# Patient Record
Sex: Female | Born: 1975 | Race: White | Hispanic: No | Marital: Married | Smoking: Never smoker
Health system: Southern US, Community
[De-identification: ages and names within clinical notes are randomized; demographics above are authoritative.]

## PROBLEM LIST (undated history)

## (undated) DIAGNOSIS — E039 Hypothyroidism, unspecified: Secondary | ICD-10-CM

## (undated) DIAGNOSIS — E785 Hyperlipidemia, unspecified: Secondary | ICD-10-CM

## (undated) DIAGNOSIS — M549 Dorsalgia, unspecified: Secondary | ICD-10-CM

## (undated) DIAGNOSIS — M2550 Pain in unspecified joint: Secondary | ICD-10-CM

## (undated) DIAGNOSIS — M069 Rheumatoid arthritis, unspecified: Secondary | ICD-10-CM

## (undated) HISTORY — DX: Hypothyroidism, unspecified: E03.9

## (undated) HISTORY — PX: WRIST SURGERY: SHX841

## (undated) HISTORY — DX: Hyperlipidemia, unspecified: E78.5

## (undated) HISTORY — DX: Pain in unspecified joint: M25.50

## (undated) HISTORY — DX: Dorsalgia, unspecified: M54.9

## (undated) HISTORY — DX: Rheumatoid arthritis, unspecified: M06.9

## (undated) HISTORY — PX: EYE SURGERY: SHX253

## (undated) HISTORY — PX: HIP SURGERY: SHX245

---

## 1998-05-06 ENCOUNTER — Emergency Department (HOSPITAL_COMMUNITY): Admission: EM | Admit: 1998-05-06 | Discharge: 1998-05-06 | Payer: Self-pay | Admitting: Emergency Medicine

## 2001-01-12 ENCOUNTER — Emergency Department (HOSPITAL_COMMUNITY): Admission: EM | Admit: 2001-01-12 | Discharge: 2001-01-12 | Payer: Self-pay | Admitting: Emergency Medicine

## 2003-06-03 ENCOUNTER — Emergency Department (HOSPITAL_COMMUNITY): Admission: EM | Admit: 2003-06-03 | Discharge: 2003-06-03 | Payer: Self-pay | Admitting: Emergency Medicine

## 2004-09-28 ENCOUNTER — Encounter: Admission: RE | Admit: 2004-09-28 | Discharge: 2004-09-28 | Payer: Self-pay | Admitting: Orthopedic Surgery

## 2004-09-28 IMAGING — RF DG FL GUIDE NDL PLMT  - NRPT [facility]
2 series · 2 of 2 positions shown · IV contrast (omniscan)
Comparison: none

CLINICAL DATA: Right hip pain.
 RIGHT HIP INJECTION FOR MRI:
 The skin anterior and lateral to the right hip was cleansed and anesthetized.  A 22 gauge spinal needle was directed into the hip joint on one pass.  10 cc of  fluid containing dilute iodinated contrast with 1% Lidocaine and a .5 cc of Omniscan were instilled.  The procedure was well-tolerated, and the patient was taken to MR 
 in good condition.  The arthrogram appears grossly normal.

[Series 1: (hospital) · 1 of 1 slices shown]
[im 1/1]
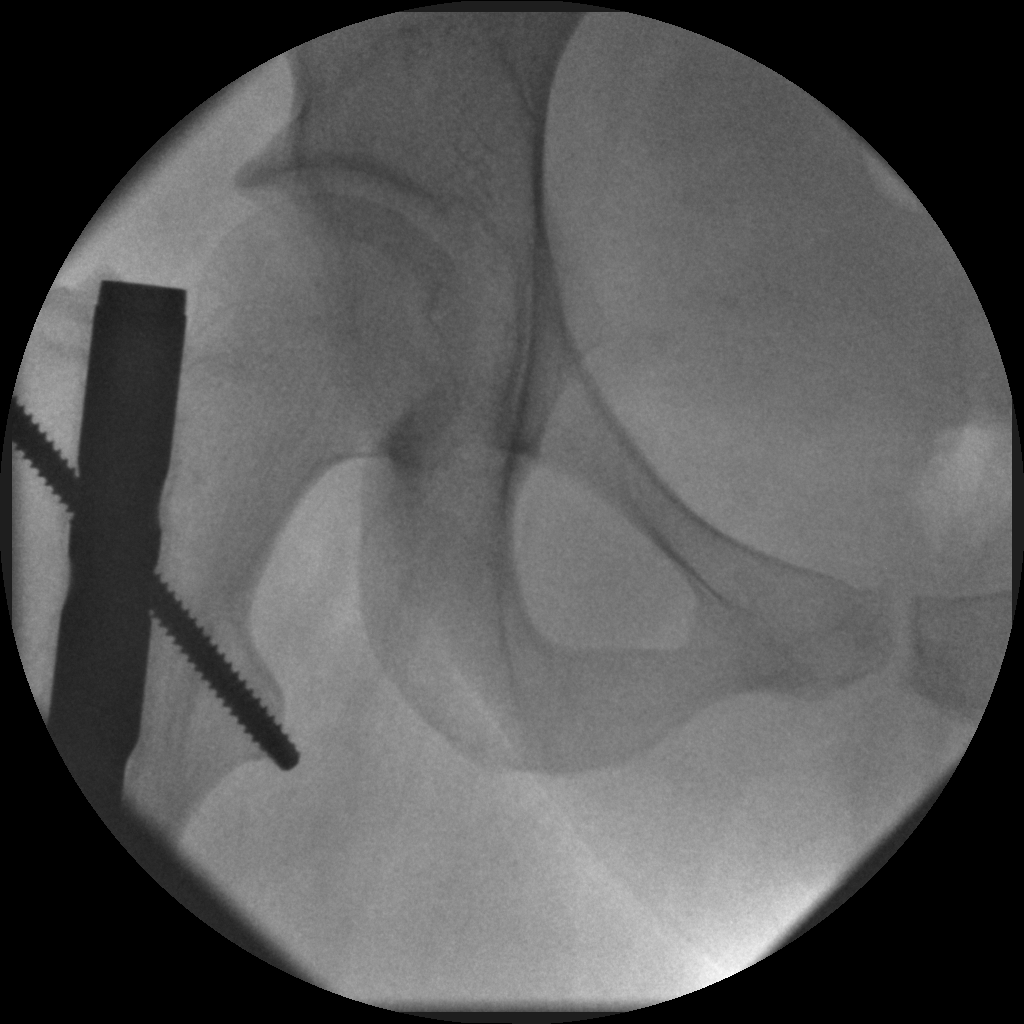

[Series 2: arthrogram · 1 of 1 slices shown]
[im 1/1]
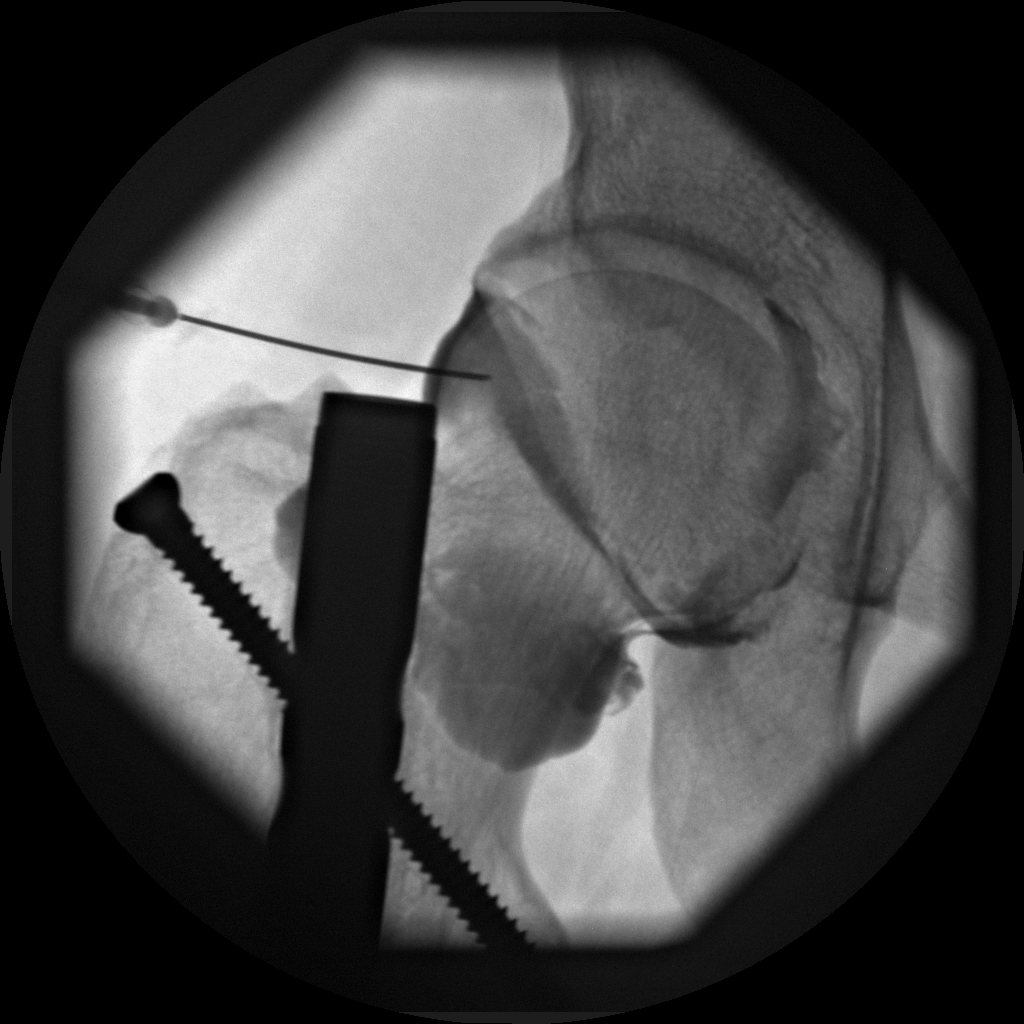

[2 of 2 positions shown; findings below may reference images not displayed]

IMPRESSION: Right hip injection for MR.

## 2005-06-18 ENCOUNTER — Emergency Department (HOSPITAL_COMMUNITY): Admission: EM | Admit: 2005-06-18 | Discharge: 2005-06-18 | Payer: Self-pay | Admitting: Family Medicine

## 2007-12-15 ENCOUNTER — Emergency Department (HOSPITAL_COMMUNITY): Admission: EM | Admit: 2007-12-15 | Discharge: 2007-12-15 | Payer: Self-pay | Admitting: Family Medicine

## 2014-08-06 ENCOUNTER — Encounter: Payer: Self-pay | Admitting: Family Medicine

## 2014-09-08 DIAGNOSIS — R002 Palpitations: Secondary | ICD-10-CM | POA: Insufficient documentation

## 2015-10-15 DIAGNOSIS — Z6835 Body mass index (BMI) 35.0-35.9, adult: Secondary | ICD-10-CM | POA: Diagnosis not present

## 2015-10-15 DIAGNOSIS — E039 Hypothyroidism, unspecified: Secondary | ICD-10-CM | POA: Diagnosis not present

## 2015-10-15 DIAGNOSIS — E669 Obesity, unspecified: Secondary | ICD-10-CM | POA: Diagnosis not present

## 2016-07-21 DIAGNOSIS — E039 Hypothyroidism, unspecified: Secondary | ICD-10-CM | POA: Diagnosis not present

## 2016-07-21 DIAGNOSIS — Z6834 Body mass index (BMI) 34.0-34.9, adult: Secondary | ICD-10-CM | POA: Diagnosis not present

## 2016-07-21 DIAGNOSIS — M47812 Spondylosis without myelopathy or radiculopathy, cervical region: Secondary | ICD-10-CM | POA: Diagnosis not present

## 2016-07-21 DIAGNOSIS — M542 Cervicalgia: Secondary | ICD-10-CM | POA: Diagnosis not present

## 2016-08-02 DIAGNOSIS — Z6834 Body mass index (BMI) 34.0-34.9, adult: Secondary | ICD-10-CM | POA: Diagnosis not present

## 2016-08-02 DIAGNOSIS — M542 Cervicalgia: Secondary | ICD-10-CM | POA: Diagnosis not present

## 2016-08-02 DIAGNOSIS — Z139 Encounter for screening, unspecified: Secondary | ICD-10-CM | POA: Diagnosis not present

## 2016-12-02 DIAGNOSIS — N3090 Cystitis, unspecified without hematuria: Secondary | ICD-10-CM | POA: Diagnosis not present

## 2016-12-02 DIAGNOSIS — R300 Dysuria: Secondary | ICD-10-CM | POA: Diagnosis not present

## 2017-02-04 DIAGNOSIS — M25571 Pain in right ankle and joints of right foot: Secondary | ICD-10-CM | POA: Diagnosis not present

## 2017-03-03 DIAGNOSIS — M5432 Sciatica, left side: Secondary | ICD-10-CM | POA: Diagnosis not present

## 2017-03-03 DIAGNOSIS — M25552 Pain in left hip: Secondary | ICD-10-CM | POA: Diagnosis not present

## 2017-03-06 DIAGNOSIS — M545 Low back pain: Secondary | ICD-10-CM | POA: Diagnosis not present

## 2017-03-21 DIAGNOSIS — M5137 Other intervertebral disc degeneration, lumbosacral region: Secondary | ICD-10-CM | POA: Diagnosis not present

## 2017-03-21 DIAGNOSIS — M545 Low back pain: Secondary | ICD-10-CM | POA: Diagnosis not present

## 2017-03-21 DIAGNOSIS — M7062 Trochanteric bursitis, left hip: Secondary | ICD-10-CM | POA: Diagnosis not present

## 2017-04-03 DIAGNOSIS — J0100 Acute maxillary sinusitis, unspecified: Secondary | ICD-10-CM | POA: Diagnosis not present

## 2017-04-09 DIAGNOSIS — R509 Fever, unspecified: Secondary | ICD-10-CM | POA: Diagnosis not present

## 2017-04-09 DIAGNOSIS — J0100 Acute maxillary sinusitis, unspecified: Secondary | ICD-10-CM | POA: Diagnosis not present

## 2017-04-09 DIAGNOSIS — J209 Acute bronchitis, unspecified: Secondary | ICD-10-CM | POA: Diagnosis not present

## 2017-09-14 DIAGNOSIS — H5015 Alternating exotropia: Secondary | ICD-10-CM | POA: Diagnosis not present

## 2017-10-18 DIAGNOSIS — E6609 Other obesity due to excess calories: Secondary | ICD-10-CM | POA: Insufficient documentation

## 2017-10-18 DIAGNOSIS — E669 Obesity, unspecified: Secondary | ICD-10-CM | POA: Insufficient documentation

## 2017-10-18 DIAGNOSIS — E039 Hypothyroidism, unspecified: Secondary | ICD-10-CM | POA: Insufficient documentation

## 2017-10-18 DIAGNOSIS — Z6831 Body mass index (BMI) 31.0-31.9, adult: Secondary | ICD-10-CM | POA: Insufficient documentation

## 2017-10-18 DIAGNOSIS — Z8679 Personal history of other diseases of the circulatory system: Secondary | ICD-10-CM | POA: Insufficient documentation

## 2018-03-14 DIAGNOSIS — E782 Mixed hyperlipidemia: Secondary | ICD-10-CM | POA: Insufficient documentation

## 2019-01-30 DIAGNOSIS — M542 Cervicalgia: Secondary | ICD-10-CM | POA: Diagnosis not present

## 2019-04-10 DIAGNOSIS — M542 Cervicalgia: Secondary | ICD-10-CM | POA: Diagnosis not present

## 2019-04-15 DIAGNOSIS — M542 Cervicalgia: Secondary | ICD-10-CM | POA: Diagnosis not present

## 2019-04-23 DIAGNOSIS — M5412 Radiculopathy, cervical region: Secondary | ICD-10-CM | POA: Diagnosis not present

## 2019-04-23 DIAGNOSIS — M542 Cervicalgia: Secondary | ICD-10-CM | POA: Diagnosis not present

## 2019-04-23 DIAGNOSIS — M5022 Other cervical disc displacement, mid-cervical region, unspecified level: Secondary | ICD-10-CM | POA: Diagnosis not present

## 2019-04-30 DIAGNOSIS — M542 Cervicalgia: Secondary | ICD-10-CM | POA: Diagnosis not present

## 2019-04-30 DIAGNOSIS — M50123 Cervical disc disorder at C6-C7 level with radiculopathy: Secondary | ICD-10-CM | POA: Diagnosis not present

## 2019-05-07 DIAGNOSIS — M50123 Cervical disc disorder at C6-C7 level with radiculopathy: Secondary | ICD-10-CM | POA: Diagnosis not present

## 2019-05-07 DIAGNOSIS — M542 Cervicalgia: Secondary | ICD-10-CM | POA: Diagnosis not present

## 2019-05-09 DIAGNOSIS — M50123 Cervical disc disorder at C6-C7 level with radiculopathy: Secondary | ICD-10-CM | POA: Diagnosis not present

## 2019-05-09 DIAGNOSIS — M542 Cervicalgia: Secondary | ICD-10-CM | POA: Diagnosis not present

## 2019-05-14 DIAGNOSIS — M542 Cervicalgia: Secondary | ICD-10-CM | POA: Diagnosis not present

## 2019-05-14 DIAGNOSIS — M50123 Cervical disc disorder at C6-C7 level with radiculopathy: Secondary | ICD-10-CM | POA: Diagnosis not present

## 2019-05-16 DIAGNOSIS — M542 Cervicalgia: Secondary | ICD-10-CM | POA: Diagnosis not present

## 2019-05-16 DIAGNOSIS — M50123 Cervical disc disorder at C6-C7 level with radiculopathy: Secondary | ICD-10-CM | POA: Diagnosis not present

## 2019-05-21 DIAGNOSIS — M542 Cervicalgia: Secondary | ICD-10-CM | POA: Diagnosis not present

## 2019-05-21 DIAGNOSIS — M50123 Cervical disc disorder at C6-C7 level with radiculopathy: Secondary | ICD-10-CM | POA: Diagnosis not present

## 2019-05-28 DIAGNOSIS — M542 Cervicalgia: Secondary | ICD-10-CM | POA: Diagnosis not present

## 2019-05-28 DIAGNOSIS — M50123 Cervical disc disorder at C6-C7 level with radiculopathy: Secondary | ICD-10-CM | POA: Diagnosis not present

## 2019-05-30 DIAGNOSIS — M50123 Cervical disc disorder at C6-C7 level with radiculopathy: Secondary | ICD-10-CM | POA: Diagnosis not present

## 2019-05-30 DIAGNOSIS — M542 Cervicalgia: Secondary | ICD-10-CM | POA: Diagnosis not present

## 2019-06-04 DIAGNOSIS — M542 Cervicalgia: Secondary | ICD-10-CM | POA: Diagnosis not present

## 2019-06-04 DIAGNOSIS — M50123 Cervical disc disorder at C6-C7 level with radiculopathy: Secondary | ICD-10-CM | POA: Diagnosis not present

## 2019-09-12 DIAGNOSIS — H66002 Acute suppurative otitis media without spontaneous rupture of ear drum, left ear: Secondary | ICD-10-CM | POA: Diagnosis not present

## 2019-09-19 DIAGNOSIS — H9202 Otalgia, left ear: Secondary | ICD-10-CM | POA: Diagnosis not present

## 2019-09-19 DIAGNOSIS — H93293 Other abnormal auditory perceptions, bilateral: Secondary | ICD-10-CM | POA: Diagnosis not present

## 2019-10-24 DIAGNOSIS — Z20822 Contact with and (suspected) exposure to covid-19: Secondary | ICD-10-CM | POA: Diagnosis not present

## 2019-10-26 ENCOUNTER — Ambulatory Visit (HOSPITAL_COMMUNITY)
Admission: RE | Admit: 2019-10-26 | Discharge: 2019-10-26 | Disposition: A | Discharge status: Home / Self Care | Payer: BC Managed Care – PPO | Source: Ambulatory Visit | Attending: Pulmonary Disease | Admitting: Pulmonary Disease

## 2019-10-26 ENCOUNTER — Other Ambulatory Visit: Payer: Self-pay | Admitting: Family

## 2019-10-26 ENCOUNTER — Telehealth: Payer: Self-pay | Admitting: Family

## 2019-10-26 DIAGNOSIS — U071 COVID-19: Secondary | ICD-10-CM

## 2019-10-26 DIAGNOSIS — Z6832 Body mass index (BMI) 32.0-32.9, adult: Secondary | ICD-10-CM | POA: Insufficient documentation

## 2019-10-26 MED ORDER — SODIUM CHLORIDE 0.9 % IV SOLN
1200.0000 mg | Freq: Once | INTRAVENOUS | Status: AC
  Administered 2019-10-26: 1200 mg via INTRAVENOUS

## 2019-10-26 MED ORDER — ALBUTEROL SULFATE HFA 108 (90 BASE) MCG/ACT IN AERS: 2.0000 | INHALATION_SPRAY | Freq: Once | RESPIRATORY_TRACT | Status: DC | PRN

## 2019-10-26 MED ORDER — SODIUM CHLORIDE 0.9 % IV SOLN: INTRAVENOUS | Status: DC | PRN

## 2019-10-26 MED ORDER — FAMOTIDINE IN NACL 20-0.9 MG/50ML-% IV SOLN: 20.0000 mg | Freq: Once | INTRAVENOUS | Status: DC | PRN

## 2019-10-26 MED ORDER — EPINEPHRINE 0.3 MG/0.3ML IJ SOAJ: 0.3000 mg | Freq: Once | INTRAMUSCULAR | Status: DC | PRN

## 2019-10-26 MED ORDER — METHYLPREDNISOLONE SODIUM SUCC 125 MG IJ SOLR: 125.0000 mg | Freq: Once | INTRAMUSCULAR | Status: DC | PRN

## 2019-10-26 MED ORDER — DIPHENHYDRAMINE HCL 50 MG/ML IJ SOLN: 50.0000 mg | Freq: Once | INTRAMUSCULAR | Status: DC | PRN

## 2019-10-26 NOTE — Progress Notes (Signed)
I connected by phone with Yisroel Ramming on 10/26/2019 at 9:27 AM to discuss the potential use of a new treatment for mild to moderate COVID-19 viral infection in non-hospitalized patients.  This patient is a 44 y.o. female that meets the FDA criteria for Emergency Use Authorization of COVID monoclonal antibody casirivimab/imdevimab.  Has a (+) direct SARS-CoV-2 viral test result  Has mild or moderate COVID-19   Is NOT hospitalized due to COVID-19  Is within 10 days of symptom onset  Has at least one of the high risk factor(s) for progression to severe COVID-19 and/or hospitalization as defined in EUA.  Specific high risk criteria : BMI > 25   I have spoken and communicated the following to the patient or parent/caregiver regarding COVID monoclonal antibody treatment:  1. FDA has authorized the emergency use for the treatment of mild to moderate COVID-19 in adults and pediatric patients with positive results of direct SARS-CoV-2 viral testing who are 42 years of age and older weighing at least 40 kg, and who are at high risk for progressing to severe COVID-19 and/or hospitalization.  2. The significant known and potential risks and benefits of COVID monoclonal antibody, and the extent to which such potential risks and benefits are unknown.  3. Information on available alternative treatments and the risks and benefits of those alternatives, including clinical trials.  4. Patients treated with COVID monoclonal antibody should continue to self-isolate and use infection control measures (e.g., wear mask, isolate, social distance, avoid sharing personal items, clean and disinfect "high touch" surfaces, and frequent handwashing) according to CDC guidelines.   5. The patient or parent/caregiver has the option to accept or refuse COVID monoclonal antibody treatment.  After reviewing this information with the patient, The patient agreed to proceed with receiving casirivimab\imdevimab infusion and  will be provided a copy of the Fact sheet prior to receiving the infusion.   Jeanine Luz, NP 10/26/2019 9:27 AM

## 2019-10-26 NOTE — Discharge Instructions (Signed)

## 2019-10-26 NOTE — Telephone Encounter (Signed)
Called to Discuss with patient about Covid symptoms and the use of the monoclonal antibody infusion for those with mild to moderate Covid symptoms and at a high risk of hospitalization.     Pt appears to qualify for this infusion due to co-morbid conditions and/or a member of an at-risk group in accordance with the FDA Emergency Use Authorization.    Symptoms started 10/25/19.  [facility] positive test positive  today. Current symptoms include fevers, coughing, headache, and chest pain at times. Qualifying risk factors include BMI >25. Discussed the risks and benefits of treatment with Regeneron and she wishes to continue with therapy.   Hello Kaitlyn Stone,   You have been scheduled to receive Regeneron (the monoclonal antibody we discussed) on : 10/25/19 at 2pm  If you have been tested outside of a [facility] - you MUST bring a copy of your positive test with you the morning of your appointment. You may take a photo of this and upload to your MyChart portal or have the testing facility fax the result to [phone]    The address for the infusion clinic site is:  --GPS address is [address] - the parking is located near [employer] building where you will see  COVID19 Infusion feather banner marking the entrance to parking.   (see photos below)            --Enter into the 2nd entrance where the "wave, flag banner" is at the road. Turn into this 2nd entrance and immediately turn left to park in 1 of the 5 parking spots.   --Please stay in your car and call the desk for assistance inside [phone].   --Average time in department is roughly 2 hours for Regeneron treatment - this includes preparation of the medication, IV start and the required 1 hour monitoring after the infusion.    Should you develop worsening shortness of breath, chest pain or severe breathing problems please do not wait for this appointment and go to the Emergency room for  evaluation and treatment. You will undergo another oxygen screen before your infusion to ensure this is the best treatment option for you. There is a chance that the best decision may be to send you to the Emergency Room for evaluation at the time of your appointment.   The day of your visit you should: Marland Kitchen Get plenty of rest the night before and drink plenty of water . Eat a light meal/snack before coming and take your medications as prescribed  . Wear warm, comfortable clothes with a shirt that can roll-up over the elbow (will need IV start).  . Wear a mask  . Consider bringing some activity to help pass the time  Many commercial insurers are waiving bills related to COVID treatment however some have ranged from $300-640. We are starting to see some insurers send bills to patients later for the administration of the medication - we are learning more information but you may receive a bill after your appointment.  Please contact your insurance agent to discuss prior to your appointment if you would like further details about billing specific to your policy.    The CPT code is [phone] for your reference.    Marcos Eke, NP 10/26/2019 9:26 AM

## 2019-10-26 NOTE — Progress Notes (Signed)
  Diagnosis: COVID-19  Physician: Patrick Wright, MD  Procedure: Covid Infusion Clinic Med: casirivimab\imdevimab infusion - Provided patient with casirivimab\imdevimab fact sheet for patients, parents and caregivers prior to infusion.  Complications: No immediate complications noted.  Discharge: Discharged home   Kaitlyn Stone 10/26/2019  

## 2019-11-12 DIAGNOSIS — U071 COVID-19: Secondary | ICD-10-CM | POA: Diagnosis not present

## 2019-11-21 DIAGNOSIS — E782 Mixed hyperlipidemia: Secondary | ICD-10-CM | POA: Diagnosis not present

## 2019-11-21 DIAGNOSIS — E039 Hypothyroidism, unspecified: Secondary | ICD-10-CM | POA: Diagnosis not present

## 2019-11-21 DIAGNOSIS — Z1231 Encounter for screening mammogram for malignant neoplasm of breast: Secondary | ICD-10-CM | POA: Diagnosis not present

## 2019-12-09 DIAGNOSIS — Z1231 Encounter for screening mammogram for malignant neoplasm of breast: Secondary | ICD-10-CM | POA: Diagnosis not present

## 2020-04-20 DIAGNOSIS — M25521 Pain in right elbow: Secondary | ICD-10-CM | POA: Diagnosis not present

## 2020-05-28 ENCOUNTER — Encounter (INDEPENDENT_AMBULATORY_CARE_PROVIDER_SITE_OTHER): Payer: Self-pay | Admitting: Family Medicine

## 2020-05-28 ENCOUNTER — Other Ambulatory Visit: Payer: Self-pay

## 2020-05-28 ENCOUNTER — Ambulatory Visit (INDEPENDENT_AMBULATORY_CARE_PROVIDER_SITE_OTHER): Payer: BC Managed Care – PPO | Admitting: Family Medicine

## 2020-05-28 VITALS — BP 120/76 | HR 65 | Temp 98.1°F | Ht 62.0 in | Wt 182.0 lb

## 2020-05-28 DIAGNOSIS — M069 Rheumatoid arthritis, unspecified: Secondary | ICD-10-CM

## 2020-05-28 DIAGNOSIS — Z1331 Encounter for screening for depression: Secondary | ICD-10-CM | POA: Diagnosis not present

## 2020-05-28 DIAGNOSIS — R948 Abnormal results of function studies of other organs and systems: Secondary | ICD-10-CM | POA: Diagnosis not present

## 2020-05-28 DIAGNOSIS — E669 Obesity, unspecified: Secondary | ICD-10-CM

## 2020-05-28 DIAGNOSIS — R0602 Shortness of breath: Secondary | ICD-10-CM

## 2020-05-28 DIAGNOSIS — R5383 Other fatigue: Secondary | ICD-10-CM | POA: Diagnosis not present

## 2020-05-28 DIAGNOSIS — R7301 Impaired fasting glucose: Secondary | ICD-10-CM | POA: Diagnosis not present

## 2020-05-28 DIAGNOSIS — Z6833 Body mass index (BMI) 33.0-33.9, adult: Secondary | ICD-10-CM

## 2020-05-28 DIAGNOSIS — Z0289 Encounter for other administrative examinations: Secondary | ICD-10-CM

## 2020-05-28 DIAGNOSIS — E038 Other specified hypothyroidism: Secondary | ICD-10-CM | POA: Diagnosis not present

## 2020-05-28 DIAGNOSIS — Z9189 Other specified personal risk factors, not elsewhere classified: Secondary | ICD-10-CM | POA: Diagnosis not present

## 2020-05-28 DIAGNOSIS — E782 Mixed hyperlipidemia: Secondary | ICD-10-CM

## 2020-05-28 MED ORDER — MELOXICAM 15 MG PO TABS: 15.0000 mg | ORAL_TABLET | Freq: Every day | ORAL | 0 refills | Status: DC

## 2020-05-29 LAB — ANEMIA PANEL
Ferritin: 233 ng/mL — ABNORMAL HIGH (ref 15–150)
Folate, Hemolysate: 339 ng/mL
Folate, RBC: 721 ng/mL (ref >498)
Hematocrit: 47 % — ABNORMAL HIGH (ref 34.0–46.6)
Iron Saturation: 28 % (ref 15–55)
Iron: 89 ug/dL (ref 27–159)
Retic Ct Pct: 1.2 % (ref 0.6–2.6)
Total Iron Binding Capacity: 317 ug/dL (ref 250–450)
UIBC: 228 ug/dL (ref 131–425)
Vitamin B-12: 503 pg/mL (ref 232–1245)

## 2020-05-29 LAB — HEMOGLOBIN A1C
Est. average glucose Bld gHb Est-mCnc: 117 mg/dL
Hgb A1c MFr Bld: 5.7 % — ABNORMAL HIGH (ref 4.8–5.6)

## 2020-05-29 LAB — CBC WITH DIFFERENTIAL/PLATELET
Basophils Absolute: 0 10*3/uL (ref 0.0–0.2)
Basos: 0 %
EOS (ABSOLUTE): 0.1 10*3/uL (ref 0.0–0.4)
Eos: 2 %
Hemoglobin: 15.3 g/dL (ref 11.1–15.9)
Immature Grans (Abs): 0 10*3/uL (ref 0.0–0.1)
Immature Granulocytes: 0 %
Lymphocytes Absolute: 1.7 10*3/uL (ref 0.7–3.1)
Lymphs: 31 %
MCH: 28.2 pg (ref 26.6–33.0)
MCHC: 32.6 g/dL (ref 31.5–35.7)
MCV: 87 fL (ref 79–97)
Monocytes Absolute: 0.4 10*3/uL (ref 0.1–0.9)
Monocytes: 7 %
Neutrophils Absolute: 3.2 10*3/uL (ref 1.4–7.0)
Neutrophils: 60 %
Platelets: 235 10*3/uL (ref 150–450)
RBC: 5.42 x10E6/uL — ABNORMAL HIGH (ref 3.77–5.28)
RDW: 14.2 % (ref 11.7–15.4)
WBC: 5.4 10*3/uL (ref 3.4–10.8)

## 2020-05-29 LAB — COMPREHENSIVE METABOLIC PANEL
ALT: 16 IU/L (ref 0–32)
AST: 16 IU/L (ref 0–40)
Albumin/Globulin Ratio: 2 (ref 1.2–2.2)
Albumin: 4.9 g/dL — ABNORMAL HIGH (ref 3.8–4.8)
Alkaline Phosphatase: 70 IU/L (ref 44–121)
BUN/Creatinine Ratio: 14 (ref 9–23)
BUN: 15 mg/dL (ref 6–24)
Bilirubin Total: 0.4 mg/dL (ref 0.0–1.2)
CO2: 21 mmol/L (ref 20–29)
Calcium: 9.5 mg/dL (ref 8.7–10.2)
Chloride: 103 mmol/L (ref 96–106)
Creatinine, Ser: 1.05 mg/dL — ABNORMAL HIGH (ref 0.57–1.00)
Globulin, Total: 2.4 g/dL (ref 1.5–4.5)
Glucose: 88 mg/dL (ref 65–99)
Potassium: 4.5 mmol/L (ref 3.5–5.2)
Sodium: 144 mmol/L (ref 134–144)
Total Protein: 7.3 g/dL (ref 6.0–8.5)
eGFR: 67 mL/min/{1.73_m2} (ref >59)

## 2020-05-29 LAB — INSULIN, RANDOM: INSULIN: 16.4 u[IU]/mL (ref 2.6–24.9)

## 2020-05-29 LAB — LIPID PANEL
Chol/HDL Ratio: 4.6 ratio — ABNORMAL HIGH (ref 0.0–4.4)
Cholesterol, Total: 276 mg/dL — ABNORMAL HIGH (ref 100–199)
HDL: 60 mg/dL (ref >39)
LDL Chol Calc (NIH): 191 mg/dL — ABNORMAL HIGH (ref 0–99)
Triglycerides: 138 mg/dL (ref 0–149)
VLDL Cholesterol Cal: 25 mg/dL (ref 5–40)

## 2020-05-29 LAB — VITAMIN D 25 HYDROXY (VIT D DEFICIENCY, FRACTURES): Vit D, 25-Hydroxy: 26.7 ng/mL — ABNORMAL LOW (ref 30.0–100.0)

## 2020-05-29 LAB — TSH: TSH: 2.89 u[IU]/mL (ref 0.450–4.500)

## 2020-05-29 LAB — URIC ACID: Uric Acid: 5.7 mg/dL (ref 2.6–6.2)

## 2020-05-29 LAB — T4, FREE: Free T4: 1.33 ng/dL (ref 0.82–1.77)

## 2020-06-01 NOTE — Progress Notes (Signed)
Chief Complaint:   OBESITY Kaitlyn Stone (MR# [id number]) is a 45 y.o. female who presents for evaluation and treatment of obesity and related comorbidities. Current BMI is Body mass index is 33.29 kg/m. Kaitlyn Stone has been struggling with her weight for many years and has been unsuccessful in either losing weight, maintaining weight loss, or reaching her healthy weight goal.  Kaitlyn Stone is currently in the action stage of change and ready to dedicate time achieving and maintaining a healthier weight. Kaitlyn Stone is interested in becoming our patient and working on intensive lifestyle modifications including (but not limited to) diet and exercise for weight loss.  Kaitlyn Stone provided the following food recall today:  Breakfast:  Skips. Lunch:  Salad, protein shake. Dinner:  5-6 ounces of meat, vegetable. No after dinner eating. She has a sedentary job.  Kaitlyn Stone's habits were reviewed today and are as follows: Her family eats meals together, she thinks her family will eat healthier with her, her desired weight loss is 39 pounds, she started gaining weight after pregnancy, her heaviest weight ever was 199 pounds, she craves sweet potatoes and fruit, she snacks frequently in the evenings, she skips breakfast frequently, she is frequently drinking liquids with calories, she frequently makes poor food choices, she frequently eats larger portions than normal and she struggles with emotional eating.  Depression Screen Kaitlyn Stone's Food and Mood (modified PHQ-9) score was 6.  Depression screen [facility] 2/9 05/28/2020  Decreased Interest 1  Down, Depressed, Hopeless 0  PHQ - 2 Score 1  Altered sleeping 1  Tired, decreased energy 1  Change in appetite 1  Feeling bad or failure about yourself  1  Trouble concentrating 1  Moving slowly or fidgety/restless 0  Suicidal thoughts 0  PHQ-9 Score 6  Difficult doing work/chores Not difficult at all   Assessment/Plan:   1. Other fatigue [name] denies daytime  somnolence and denies waking up still tired. Patent has a history of symptoms of morning headache and snoring. [name] generally gets 6 or 7 hours of sleep per night, and states that she has generally restful sleep. Snoring is present. Apneic episodes are not present. Epworth Sleepiness Score is 4.  [name] does feel that her weight is causing her energy to be lower than it should be. Fatigue may be related to obesity, depression or many other causes. Labs will be ordered, and in the meanwhile, Kaitlyn Stone will focus on self care including making healthy food choices, increasing physical activity and focusing on stress reduction.  - EKG 12-Lead - Anemia panel - CBC with Differential/Platelet - Lipid panel - VITAMIN D 25 Hydroxy (Vit-D Deficiency, Fractures) - TSH - T4, free - Uric acid  2. SOB (shortness of breath) on exertion Kaitlyn Stone notes increasing shortness of breath with exercising and seems to be worsening over time with weight gain. She notes getting out of breath sooner with activity than she used to. This has gotten worse recently. Kaitlyn Stone denies shortness of breath at rest or orthopnea.  Kaitlyn Stone does feel that she gets out of breath more easily that she used to when she exercises. Kaitlyn Stone's shortness of breath appears to be obesity related and exercise induced. She has agreed to work on weight loss and gradually increase exercise to treat her exercise induced shortness of breath. Will continue to monitor closely.  - Anemia panel - CBC with Differential/Platelet - Uric acid  3. Abnormal metabolism, slow [name]'s metabolism is slower than normal.  Plan:  Will check thyroid labs today.  - TSH -  T4, free  4. Other specified hypothyroidism Course: Stable. Medication: Synthroid 125 mcg daily.   Plan:  Continue Synthroid.  Patient was instructed not to take MVM or iron within 4 hours of taking thyroid medications.  We will continue to monitor alongside Endocrinology/PCP as it relates to her  weight loss journey.  Check TSH and free T4 today.  Lab Results  Component Value Date   TSH 2.890 05/28/2020   - TSH - T4, free  5. Mixed hyperlipidemia Course: Not at goal. Lipid-lowering medications: None.  Kaitlyn Stone has a family history of hyperlipidemia.  Plan: Dietary changes: Increase soluble fiber, decrease simple carbohydrates, decrease saturated fat. Exercise changes: Moderate to vigorous-intensity aerobic activity 150 minutes per week or as tolerated. We will continue to monitor along with PCP/specialists as it pertains to her weight loss journey.  Check lipid panel today.  Lab Results  Component Value Date   CHOL 276 (H) 05/28/2020   HDL 60 05/28/2020   LDLCALC 191 (H) 05/28/2020   TRIG 138 05/28/2020   CHOLHDL 4.6 (H) 05/28/2020   Lab Results  Component Value Date   ALT 16 05/28/2020   AST 16 05/28/2020   ALKPHOS 70 05/28/2020   BILITOT 0.4 05/28/2020   The 10-year ASCVD risk score Kaitlyn [name] DC Jr., et al., 2013) is: 1.1%   Values used to calculate the score:     Age: 8 years     Sex: Female     Is Non-Hispanic African American: No     Diabetic: No     Tobacco smoker: No     Systolic Blood Pressure: 120 mmHg     Is BP treated: No     HDL Cholesterol: 60 mg/dL     Total Cholesterol: 276 mg/dL  - Lipid panel  6. Rheumatoid arthritis, involving unspecified site, unspecified whether rheumatoid factor present [facility]) Will place referral to Rheumatology today as well as checking labs.  Will also start Mobic 15 mg daily for anti-inflammatory effect.   - Uric acid - Ambulatory referral to Rheumatology - Start meloxicam (MOBIC) 15 MG tablet; Take 1 tablet (15 mg total) by mouth daily.  Dispense: 30 tablet; Refill: 0  7. Elevated fasting glucose Will check CMP, A1c, and insulin level today, as per below.  - Comprehensive metabolic panel - Hemoglobin A1c - Insulin, random  8. Depression screening Kaitlyn Stone was screened for depression as part of her new patient workup.   PHQ-9 is 6.  Kaitlyn Stone had a positive depression screening. Depression is commonly associated with obesity and often results in emotional eating behaviors. We will monitor this closely and work on CBT to help improve the non-hunger eating patterns. Referral to Psychology may be required if no improvement is seen as she continues in our clinic.  9. At risk for heart disease Due to Shaqueta's current state of health and medical condition(s), she is at a higher risk for heart disease.  This puts the patient at much greater risk to subsequently develop cardiopulmonary conditions that can significantly affect patient's quality of life in a negative manner.    At least 9 minutes were spent on counseling Demiyah about these concerns today. Evidence-based interventions for health behavior change were utilized today including the discussion of self monitoring techniques, problem-solving barriers, and SMART goal setting techniques.  Specifically, regarding patient's less desirable eating habits and patterns, we employed the technique of small changes when Corena has not been able to fully commit to her prudent nutritional plan.  10. Class 1 obesity  with serious comorbidity and body mass index (BMI) of 33.0 to 33.9 in adult, unspecified obesity type  Abella is currently in the action stage of change and her goal is to continue with weight loss efforts. I recommend Roux begin the structured treatment plan as follows:  She has agreed to the Category 1 Plan.  Exercise goals: No exercise has been prescribed at this time.   Behavioral modification strategies: increasing lean protein intake, decreasing simple carbohydrates, increasing vegetables, increasing water intake, decreasing liquid calories, decreasing sodium intake and increasing high fiber foods.  She was informed of the importance of frequent follow-up visits to maximize her success with intensive lifestyle modifications for her multiple health conditions.  She was informed we would discuss her lab results at her next visit unless there is a critical issue that needs to be addressed sooner. Korena agreed to keep her next visit at the agreed upon time to discuss these results.  Objective:   Blood pressure 120/76, pulse 65, temperature 98.1 F (36.7 C), temperature source Oral, height  (1.575 m), weight 182 lb (82.6 kg), SpO2 96 %. Body mass index is 33.29 kg/m.  EKG: Normal sinus rhythm, rate 80 bpm.  Indirect Calorimeter completed today shows a VO2 of 176 and a REE of 1223.  Her calculated basal metabolic rate is 9604 thus her basal metabolic rate is worse than expected.  General: Cooperative, alert, well developed, in no acute distress. HEENT: Conjunctivae and lids unremarkable. Cardiovascular: Regular rhythm.  Lungs: Normal work of breathing. Neurologic: No focal deficits.   Lab Results  Component Value Date   CREATININE 1.05 (H) 05/28/2020   BUN 15 05/28/2020   NA 144 05/28/2020   K 4.5 05/28/2020   CL 103 05/28/2020   CO2 21 05/28/2020   Lab Results  Component Value Date   ALT 16 05/28/2020   AST 16 05/28/2020   ALKPHOS 70 05/28/2020   BILITOT 0.4 05/28/2020   Lab Results  Component Value Date   HGBA1C 5.7 (H) 05/28/2020   Lab Results  Component Value Date   INSULIN 16.4 05/28/2020   Lab Results  Component Value Date   TSH 2.890 05/28/2020   Lab Results  Component Value Date   CHOL 276 (H) 05/28/2020   HDL 60 05/28/2020   LDLCALC 191 (H) 05/28/2020   TRIG 138 05/28/2020   CHOLHDL 4.6 (H) 05/28/2020   Lab Results  Component Value Date   WBC 5.4 05/28/2020   HGB 15.3 05/28/2020   HCT 47.0 (H) 05/28/2020   MCV 87 05/28/2020   PLT 235 05/28/2020   Lab Results  Component Value Date   IRON 89 05/28/2020   TIBC 317 05/28/2020   FERRITIN 233 (H) 05/28/2020   Attestation Statements:   This is the patient's first visit at [facility]. The patient's NEW PATIENT PACKET was reviewed at  length. Included in the packet: current and past health history, medications, allergies, ROS, gynecologic history (women only), surgical history, family history, social history, weight history, weight loss surgery history (for those that have had weight loss surgery), nutritional evaluation, mood and food questionnaire, PHQ9, Epworth questionnaire, sleep habits questionnaire, patient life and health improvement goals questionnaire. These will all be scanned into the patient's chart under media.   During the visit, I independently reviewed the patient's EKG, bioimpedance scale results, and indirect calorimeter results. I used this information to tailor a meal plan for the patient that will help her to lose weight and will improve her  obesity-related conditions going forward. I performed a medically necessary appropriate examination and/or evaluation. I discussed the assessment and treatment plan with the patient. The patient was provided an opportunity to ask questions and all were answered. The patient agreed with the plan and demonstrated an understanding of the instructions. Labs were ordered at this visit and will be reviewed at the next visit unless more critical results need to be addressed immediately. Clinical information was updated and documented in the EMR.   I, Insurance claims handler, CMA, am acting as transcriptionist for Helane Rima, DO  I have reviewed the above documentation for accuracy and completeness, and I agree with the above. Helane Rima, DO

## 2020-06-03 ENCOUNTER — Encounter (INDEPENDENT_AMBULATORY_CARE_PROVIDER_SITE_OTHER): Payer: Self-pay

## 2020-06-09 NOTE — Telephone Encounter (Signed)
LM with [facility] to call back

## 2020-06-10 DIAGNOSIS — M25521 Pain in right elbow: Secondary | ICD-10-CM | POA: Diagnosis not present

## 2020-06-11 ENCOUNTER — Other Ambulatory Visit: Payer: Self-pay

## 2020-06-11 ENCOUNTER — Encounter (INDEPENDENT_AMBULATORY_CARE_PROVIDER_SITE_OTHER): Payer: Self-pay | Admitting: Family Medicine

## 2020-06-11 ENCOUNTER — Ambulatory Visit (INDEPENDENT_AMBULATORY_CARE_PROVIDER_SITE_OTHER): Payer: BC Managed Care – PPO | Admitting: Family Medicine

## 2020-06-11 VITALS — BP 120/82 | HR 68 | Temp 98.1°F | Ht 62.0 in | Wt 175.0 lb

## 2020-06-11 DIAGNOSIS — E559 Vitamin D deficiency, unspecified: Secondary | ICD-10-CM

## 2020-06-11 DIAGNOSIS — E038 Other specified hypothyroidism: Secondary | ICD-10-CM | POA: Diagnosis not present

## 2020-06-11 DIAGNOSIS — M069 Rheumatoid arthritis, unspecified: Secondary | ICD-10-CM

## 2020-06-11 DIAGNOSIS — R7303 Prediabetes: Secondary | ICD-10-CM | POA: Diagnosis not present

## 2020-06-11 DIAGNOSIS — Z6833 Body mass index (BMI) 33.0-33.9, adult: Secondary | ICD-10-CM

## 2020-06-11 DIAGNOSIS — E7800 Pure hypercholesterolemia, unspecified: Secondary | ICD-10-CM

## 2020-06-11 DIAGNOSIS — E669 Obesity, unspecified: Secondary | ICD-10-CM

## 2020-06-11 DIAGNOSIS — Z9189 Other specified personal risk factors, not elsewhere classified: Secondary | ICD-10-CM

## 2020-06-15 ENCOUNTER — Encounter (INDEPENDENT_AMBULATORY_CARE_PROVIDER_SITE_OTHER): Payer: Self-pay | Admitting: Family Medicine

## 2020-06-15 DIAGNOSIS — E559 Vitamin D deficiency, unspecified: Secondary | ICD-10-CM

## 2020-06-15 NOTE — Progress Notes (Signed)
Chief Complaint:   OBESITY Kaitlyn Stone is here to discuss her progress with her obesity treatment plan along with follow-up of her obesity related diagnoses.   Today's visit was #: 2 Starting weight: 182 lbs Starting date: 182 lbs Today's weight: 175 lbs Today's date: 06/11/2020 Total lbs lost to date: 7 lbs Body mass index is 32.01 kg/m.  Total weight loss percentage to date: -3.85%  Interim History:  Kaitlyn Stone denies polyphagia. Current Meal Plan: the Category 1 Plan for 100% of the time.  Current Exercise Plan: 3,000-5,000 steps per day 7 days per week.  Assessment/Plan:   1. Prediabetes Improving, but not optimized. Goal is HgbA1c < 5.7.  Medication: None.    Plan:  Discussed labs with patient today.  She will continue to focus on protein-rich, low simple carbohydrate foods. We reviewed the importance of hydration, regular exercise for stress reduction, and restorative sleep.   Lab Results  Component Value Date   HGBA1C 5.7 (H) 05/28/2020   Lab Results  Component Value Date   INSULIN 16.4 05/28/2020   2. Pure hypercholesterolemia Course: Not at goal. Lipid-lowering medications: None.   Plan: Discussed labs with patient today. Dietary changes: Increase soluble fiber, decrease simple carbohydrates, decrease saturated fat. Exercise changes: Moderate to vigorous-intensity aerobic activity 150 minutes per week or as tolerated. We will continue to monitor along with PCP/specialists as it pertains to her weight loss journey.  Lab Results  Component Value Date   CHOL 276 (H) 05/28/2020   HDL 60 05/28/2020   LDLCALC 191 (H) 05/28/2020   TRIG 138 05/28/2020   CHOLHDL 4.6 (H) 05/28/2020   Lab Results  Component Value Date   ALT 16 05/28/2020   AST 16 05/28/2020   ALKPHOS 70 05/28/2020   BILITOT 0.4 05/28/2020   The 10-year ASCVD risk score Denman [name] DC Jr., et al., 2013) is: 1.1%   Values used to calculate the score:     Age: 45 years     Sex: Female     Is Non-Hispanic  African American: No     Diabetic: No     Tobacco smoker: No     Systolic Blood Pressure: 120 mmHg     Is BP treated: No     HDL Cholesterol: 60 mg/dL     Total Cholesterol: 276 mg/dL  3. Vitamin D deficiency Not at goal. Current vitamin D is 26.7, tested on 05/28/2020. Optimal goal > 50 ng/dL.  She is taking vitamin D 50,000 IU weekly.  Plan:  Discussed labs with patient today.  Continue to take prescription Vitamin D @50 ,000 IU every week as prescribed.  Follow-up for routine testing of Vitamin D, at least 2-3 times per year to avoid over-replacement.  4. Other specified hypothyroidism Course: At goal.  Medication: Synthroid 125 mcg daily.   Plan:  Discussed labs with patient today.  Patient was instructed not to take MVM or iron within 4 hours of taking thyroid medications.  We will continue to monitor alongside Endocrinology/PCP as it relates to her weight loss journey.   Lab Results  Component Value Date   TSH 2.890 05/28/2020   5. Rheumatoid arthritis, involving unspecified site, unspecified whether rheumatoid factor present (HCC) Kaitlyn Stone is taking Mobic 15 mg daily.  She brought her records with her today, and I reviewed them. Will scan and send with previous referral.  6. At risk for heart disease Due to Kaitlyn Stone's current state of health and medical condition(s), she is at a higher risk for  heart disease.  This puts the patient at much greater risk to subsequently develop cardiopulmonary conditions that can significantly affect patient's quality of life in a negative manner.    At least 8 minutes were spent on counseling Kaitlyn Stone about these concerns today. Evidence-based interventions for health behavior change were utilized today including the discussion of self monitoring techniques, problem-solving barriers, and SMART goal setting techniques.  Specifically, regarding patient's less desirable eating habits and patterns, we employed the technique of small changes when Kaitlyn Stone has not  been able to fully commit to her prudent nutritional plan.  7. Obesity, current BMI 32.2  Course: Kaitlyn Stone is currently in the action stage of change. As such, her goal is to continue with weight loss efforts.   Nutrition goals: She has agreed to the Category 1 Plan.   Exercise goals: As is.  Behavioral modification strategies: increasing lean protein intake, decreasing simple carbohydrates, increasing vegetables and increasing water intake.  Kaitlyn Stone has agreed to follow-up with our clinic in 2 weeks. She was informed of the importance of frequent follow-up visits to maximize her success with intensive lifestyle modifications for her multiple health conditions.   Objective:   Blood pressure 120/82, pulse 68, temperature 98.1 F (36.7 C), height 5\' 2"  (1.575 m), weight 175 lb (79.4 kg), SpO2 97 %. Body mass index is 32.01 kg/m.  General: Cooperative, alert, well developed, in no acute distress. HEENT: Conjunctivae and lids unremarkable. Cardiovascular: Regular rhythm.  Lungs: Normal work of breathing. Neurologic: No focal deficits.   Lab Results  Component Value Date   CREATININE 1.05 (H) 05/28/2020   BUN 15 05/28/2020   NA 144 05/28/2020   K 4.5 05/28/2020   CL 103 05/28/2020   CO2 21 05/28/2020   Lab Results  Component Value Date   ALT 16 05/28/2020   AST 16 05/28/2020   ALKPHOS 70 05/28/2020   BILITOT 0.4 05/28/2020   Lab Results  Component Value Date   HGBA1C 5.7 (H) 05/28/2020   Lab Results  Component Value Date   INSULIN 16.4 05/28/2020   Lab Results  Component Value Date   TSH 2.890 05/28/2020   Lab Results  Component Value Date   CHOL 276 (H) 05/28/2020   HDL 60 05/28/2020   LDLCALC 191 (H) 05/28/2020   TRIG 138 05/28/2020   CHOLHDL 4.6 (H) 05/28/2020   Lab Results  Component Value Date   WBC 5.4 05/28/2020   HGB 15.3 05/28/2020   HCT 47.0 (H) 05/28/2020   MCV 87 05/28/2020   PLT 235 05/28/2020   Lab Results  Component Value Date   IRON 89  05/28/2020   TIBC 317 05/28/2020   FERRITIN 233 (H) 05/28/2020   Attestation Statements:   Reviewed by clinician on day of visit: allergies, medications, problem list, medical history, surgical history, family history, social history, and previous encounter notes.  I, 05/30/2020, CMA, am acting as transcriptionist for Insurance claims handler, DO  I have reviewed the above documentation for accuracy and completeness, and I agree with the above. Helane Rima, DO

## 2020-06-16 MED ORDER — VITAMIN D (ERGOCALCIFEROL) 1.25 MG (50000 UNIT) PO CAPS: 50000.0000 [IU] | ORAL_CAPSULE | ORAL | 0 refills | Status: DC

## 2020-06-27 ENCOUNTER — Other Ambulatory Visit (INDEPENDENT_AMBULATORY_CARE_PROVIDER_SITE_OTHER): Payer: Self-pay | Admitting: Family Medicine

## 2020-06-27 DIAGNOSIS — M069 Rheumatoid arthritis, unspecified: Secondary | ICD-10-CM

## 2020-06-29 ENCOUNTER — Ambulatory Visit (INDEPENDENT_AMBULATORY_CARE_PROVIDER_SITE_OTHER): Payer: BC Managed Care – PPO | Admitting: Physician Assistant

## 2020-06-29 NOTE — Telephone Encounter (Signed)
Pt last seen by Dr. Wallace.  

## 2020-06-30 ENCOUNTER — Encounter (INDEPENDENT_AMBULATORY_CARE_PROVIDER_SITE_OTHER): Payer: Self-pay | Admitting: Physician Assistant

## 2020-06-30 ENCOUNTER — Other Ambulatory Visit: Payer: Self-pay

## 2020-06-30 ENCOUNTER — Ambulatory Visit (INDEPENDENT_AMBULATORY_CARE_PROVIDER_SITE_OTHER): Payer: BC Managed Care – PPO | Admitting: Physician Assistant

## 2020-06-30 VITALS — BP 127/85 | HR 88 | Temp 98.1°F | Ht 62.0 in | Wt 174.0 lb

## 2020-06-30 DIAGNOSIS — R7303 Prediabetes: Secondary | ICD-10-CM | POA: Diagnosis not present

## 2020-06-30 DIAGNOSIS — Z6831 Body mass index (BMI) 31.0-31.9, adult: Secondary | ICD-10-CM

## 2020-06-30 NOTE — Progress Notes (Signed)
Chief Complaint:   OBESITY Kaitlyn Stone is here to discuss her progress with her obesity treatment plan along with follow-up of her obesity related diagnoses. Kaitlyn Stone is on the Category 1 Plan and states she is following her eating plan approximately 95% of the time. Kaitlyn Stone states she is walking for 30 minutes 7 times per week.  Today's visit was #: 3 Starting weight: 182 lbs Starting date: 05/28/2020 Today's weight: 174 lbs Today's date: 06/30/2020 Total lbs lost to date: 8 Total lbs lost since last in-office visit: 1  Interim History: Kaitlyn Stone did well with weight loss. Her hunger is mostly controlled. She is drinking protein shakes for snack calories. She is journaling her Category 1.  Subjective:   1. Pre-diabetes Kaitlyn Stone denies polyphagia. Last A1c was 5.7. She is walking 7 days per week.  Assessment/Plan:   1. Pre-diabetes Kaitlyn Stone will continue her meal plan, exercise, and decreasing simple carbohydrates to help decrease the risk of diabetes.   2. BMI 31.0-31.9,adult Kaitlyn Stone is currently in the action stage of change. As such, her goal is to continue with weight loss efforts. She has agreed to the Category 1 Plan.   Additional breakfast and lunch options given.  Exercise goals: As is.  Behavioral modification strategies: meal planning and cooking strategies and keeping healthy foods in the home.  Kaitlyn Stone has agreed to follow-up with our clinic in 3 weeks. She was informed of the importance of frequent follow-up visits to maximize her success with intensive lifestyle modifications for her multiple health conditions.   Objective:   Blood pressure 127/85, pulse 88, temperature 98.1 F (36.7 C), height 5\' 2"  (1.575 m), weight 174 lb (78.9 kg), SpO2 97 %. Body mass index is 31.83 kg/m.  General: Cooperative, alert, well developed, in no acute distress. HEENT: Conjunctivae and lids unremarkable. Cardiovascular: Regular rhythm.  Lungs: Normal work of breathing. Neurologic: No  focal deficits.   Lab Results  Component Value Date   CREATININE 1.05 (H) 05/28/2020   BUN 15 05/28/2020   NA 144 05/28/2020   K 4.5 05/28/2020   CL 103 05/28/2020   CO2 21 05/28/2020   Lab Results  Component Value Date   ALT 16 05/28/2020   AST 16 05/28/2020   ALKPHOS 70 05/28/2020   BILITOT 0.4 05/28/2020   Lab Results  Component Value Date   HGBA1C 5.7 (H) 05/28/2020   Lab Results  Component Value Date   INSULIN 16.4 05/28/2020   Lab Results  Component Value Date   TSH 2.890 05/28/2020   Lab Results  Component Value Date   CHOL 276 (H) 05/28/2020   HDL 60 05/28/2020   LDLCALC 191 (H) 05/28/2020   TRIG 138 05/28/2020   CHOLHDL 4.6 (H) 05/28/2020   Lab Results  Component Value Date   WBC 5.4 05/28/2020   HGB 15.3 05/28/2020   HCT 47.0 (H) 05/28/2020   MCV 87 05/28/2020   PLT 235 05/28/2020   Lab Results  Component Value Date   IRON 89 05/28/2020   TIBC 317 05/28/2020   FERRITIN 233 (H) 05/28/2020   Attestation Statements:   Reviewed by clinician on day of visit: allergies, medications, problem list, medical history, surgical history, family history, social history, and previous encounter notes.  Time spent on visit including pre-visit chart review and post-visit care and charting was 25 minutes.    05/30/2020, am acting as transcriptionist for Trude Mcburney, PA-C.  I have reviewed the above documentation for accuracy and completeness, and I  agree with the above. Abby Potash, PA-C

## 2020-07-14 ENCOUNTER — Other Ambulatory Visit: Payer: Self-pay

## 2020-07-14 ENCOUNTER — Ambulatory Visit (INDEPENDENT_AMBULATORY_CARE_PROVIDER_SITE_OTHER): Payer: BC Managed Care – PPO | Admitting: Family Medicine

## 2020-07-14 ENCOUNTER — Encounter (INDEPENDENT_AMBULATORY_CARE_PROVIDER_SITE_OTHER): Payer: Self-pay | Admitting: Family Medicine

## 2020-07-14 VITALS — BP 117/79 | HR 67 | Temp 97.6°F | Ht 62.0 in | Wt 172.0 lb

## 2020-07-14 DIAGNOSIS — Z6833 Body mass index (BMI) 33.0-33.9, adult: Secondary | ICD-10-CM

## 2020-07-14 DIAGNOSIS — R7303 Prediabetes: Secondary | ICD-10-CM | POA: Diagnosis not present

## 2020-07-14 DIAGNOSIS — E669 Obesity, unspecified: Secondary | ICD-10-CM | POA: Diagnosis not present

## 2020-07-14 DIAGNOSIS — M069 Rheumatoid arthritis, unspecified: Secondary | ICD-10-CM

## 2020-07-14 DIAGNOSIS — E559 Vitamin D deficiency, unspecified: Secondary | ICD-10-CM | POA: Diagnosis not present

## 2020-07-14 MED ORDER — VITAMIN D (ERGOCALCIFEROL) 1.25 MG (50000 UNIT) PO CAPS: 50000.0000 [IU] | ORAL_CAPSULE | ORAL | 0 refills | Status: DC

## 2020-07-14 MED ORDER — MELOXICAM 15 MG PO TABS: 15.0000 mg | ORAL_TABLET | Freq: Every day | ORAL | 0 refills | Status: DC

## 2020-07-15 NOTE — Progress Notes (Signed)
Chief Complaint:   OBESITY Kaitlyn Stone is here to discuss her progress with her obesity treatment plan along with follow-up of her obesity related diagnoses. See Medical Weight Management Flowsheet for bioelectrical impedance results.  Today's visit was #: 4 Starting weight: 182 lbs Starting date: 05/28/2020 Today's weight: 172 lbs Today's date: 07/15/2020 Weight change since last visit: 2 lbs Total lbs lost to date: 10 lbs Body mass index is 31.46 kg/m.  Total weight loss percentage to date: -5.49%  Interim History:  [name] denies polyphagia. Her husband, also a patient at [facility], has been to the ED over the past week, so stress levels elevated. Nutrition Plan: the Category 1 Plan 50% of the time. Activity: None.  Assessment/Plan:   1. Pre-diabetes At goal. Goal is HgbA1c < 5.7.  Medication: None.    Plan:  She will continue to focus on protein-rich, low simple carbohydrate foods. We reviewed the importance of hydration, regular exercise for stress reduction, and restorative sleep.   Lab Results  Component Value Date   HGBA1C 5.7 (H) 05/28/2020   Lab Results  Component Value Date   INSULIN 16.4 05/28/2020   2. Rheumatoid arthritis (HCC) [name] is taking Mobic for her joint pain caused by rheumatoid arthritis.  - Refill meloxicam (MOBIC) 15 MG tablet; Take 1 tablet (15 mg total) by mouth daily.  Dispense: 90 tablet; Refill: 0  3. Vitamin D deficiency Not at goal. Current vitamin D is 26.7, tested on 05/28/2020. Optimal goal > 50 ng/dL.  She is taking vitamin D 50,000 IU weekly.  Plan: Continue to take prescription Vitamin D @50 ,000 IU every week as prescribed.  Follow-up for routine testing of Vitamin D, at least 2-3 times per year to avoid over-replacement.  - Refill Vitamin D, Ergocalciferol, (DRISDOL) 1.25 MG (50000 UNIT) CAPS capsule; Take 1 capsule (50,000 Units total) by mouth every 7 (seven) days.  Dispense: 4 capsule; Refill: 0  4. Obesity, current BMI  31.6  Course: [name] is currently in the action stage of change. As such, her goal is to continue with weight loss efforts.   Nutrition goals: She has agreed to the Category 1 Plan.   Exercise goals: All adults should avoid inactivity. Some physical activity is better than none, and adults who participate in any amount of physical activity gain some health benefits.  Behavioral modification strategies: increasing lean protein intake, decreasing simple carbohydrates, and increasing vegetables.  [name] has agreed to follow-up with our clinic in 3 weeks. She was informed of the importance of frequent follow-up visits to maximize her success with intensive lifestyle modifications for her multiple health conditions.   Objective:   Blood pressure 117/79, pulse 67, temperature 97.6 F (36.4 C), temperature source Oral, height 5\' 2"  (1.575 m), weight 172 lb (78 kg), SpO2 97 %. Body mass index is 31.46 kg/m.  General: Cooperative, alert, well developed, in no acute distress. HEENT: Conjunctivae and lids unremarkable. Cardiovascular: Regular rhythm.  Lungs: Normal work of breathing. Neurologic: No focal deficits.   Lab Results  Component Value Date   CREATININE 1.05 (H) 05/28/2020   BUN 15 05/28/2020   NA 144 05/28/2020   K 4.5 05/28/2020   CL 103 05/28/2020   CO2 21 05/28/2020   Lab Results  Component Value Date   ALT 16 05/28/2020   AST 16 05/28/2020   ALKPHOS 70 05/28/2020   BILITOT 0.4 05/28/2020   Lab Results  Component Value Date   HGBA1C 5.7 (H) 05/28/2020   Lab Results  Component Value Date   INSULIN 16.4 05/28/2020   Lab Results  Component Value Date   TSH 2.890 05/28/2020   Lab Results  Component Value Date   CHOL 276 (H) 05/28/2020   HDL 60 05/28/2020   LDLCALC 191 (H) 05/28/2020   TRIG 138 05/28/2020   CHOLHDL 4.6 (H) 05/28/2020   Lab Results  Component Value Date   WBC 5.4 05/28/2020   HGB 15.3 05/28/2020   HCT 47.0 (H) 05/28/2020   MCV 87  05/28/2020   PLT 235 05/28/2020   Lab Results  Component Value Date   IRON 89 05/28/2020   TIBC 317 05/28/2020   FERRITIN 233 (H) 05/28/2020   Attestation Statements:   Reviewed by clinician on day of visit: allergies, medications, problem list, medical history, surgical history, family history, social history, and previous encounter notes.  I, Insurance claims handler, CMA, am acting as transcriptionist for Helane Rima, DO  I have reviewed the above documentation for accuracy and completeness, and I agree with the above. Helane Rima, DO

## 2020-07-21 DIAGNOSIS — J069 Acute upper respiratory infection, unspecified: Secondary | ICD-10-CM | POA: Diagnosis not present

## 2020-07-23 ENCOUNTER — Other Ambulatory Visit: Payer: Self-pay

## 2020-07-23 ENCOUNTER — Encounter: Payer: Self-pay | Admitting: Internal Medicine

## 2020-07-23 ENCOUNTER — Ambulatory Visit: Payer: BC Managed Care – PPO | Admitting: Internal Medicine

## 2020-07-23 VITALS — BP 112/82 | HR 70 | Resp 14 | Ht 63.0 in | Wt 177.0 lb

## 2020-07-23 DIAGNOSIS — M2241 Chondromalacia patellae, right knee: Secondary | ICD-10-CM

## 2020-07-23 DIAGNOSIS — R7689 Other specified abnormal immunological findings in serum: Secondary | ICD-10-CM | POA: Insufficient documentation

## 2020-07-23 DIAGNOSIS — M2242 Chondromalacia patellae, left knee: Secondary | ICD-10-CM

## 2020-07-23 DIAGNOSIS — R768 Other specified abnormal immunological findings in serum: Secondary | ICD-10-CM | POA: Diagnosis not present

## 2020-07-23 DIAGNOSIS — M25521 Pain in right elbow: Secondary | ICD-10-CM | POA: Diagnosis not present

## 2020-07-23 NOTE — Patient Instructions (Signed)
Meloxicam capsules What is this medication? MELOXICAM (mel OX i cam) is a non-steroidal anti-inflammatory drug, also knownas an NSAID. It is used to treat pain, inflammation, and swelling. This medicine may be used for other purposes; ask your health care provider orpharmacist if you have questions. COMMON BRAND NAME(S): Vivlodex What should I tell my care team before I take this medication? They need to know if you have any of these conditions: asthma (lung or breathing disease) bleeding disorder coronary artery bypass graft (CABG) within the past 2 weeks dehydration heart attack heart disease heart failure high blood pressure if you often drink alcohol kidney disease liver disease smoke tobacco cigarettes stomach bleeding stomach ulcers, other stomach or intestine problems take medicines that treat or prevent blood clots taking other steroids like dexamethasone or prednisone an unusual or allergic reaction to meloxicam, other medicines, foods, dyes, or preservatives pregnant or trying to get pregnant breast-feeding How should I use this medication? Take this medicine by mouth. Take it as directed on the prescription label at the same time every day. You can take it with or without food. If it upsets your stomach, take it with food. Do not use it more often than directed. There may be unused or extra doses in the bottle after you finish your treatment.Talk to your health care provider if you have questions about your dose. A special MedGuide will be given to you by the pharmacist with eachprescription and refill. Be sure to read this information carefully each time. Talk to your health care provider about the use of this medicine in children.Special care may be needed. Patients over 79 years of age may have a stronger reaction and need a smallerdose. Overdosage: If you think you have taken too much of this medicine contact apoison control center or emergency room at once. NOTE:  This medicine is only for you. Do not share this medicine with others. What if I miss a dose? If you miss a dose, take it as soon as you can. If it is almost time for yournext dose, take only that dose. Do not take double or extra doses. What may interact with this medication? Do not take this medicine with any of the following medications: cidofovir ketorolac This medicine may also interact with the following medications: aspirin and aspirin-like medicines certain medicines for blood pressure, heart disease, irregular heart beat certain medicines for depression, anxiety, or psychotic disturbances certain medicines that treat or prevent blood clots like warfarin, enoxaparin, dalteparin, apixaban, dabigatran, rivaroxaban cyclosporine diuretics fluconazole lithium methotrexate other NSAIDs, medicines for pain and inflammation, like ibuprofen and naproxen pemetrexed This list may not describe all possible interactions. Give your health care provider a list of all the medicines, herbs, non-prescription drugs, or dietary supplements you use. Also tell them if you smoke, drink alcohol, or use illegaldrugs. Some items may interact with your medicine. What should I watch for while using this medication? Visit your health care provider for regular checks on your progress. Tell your health care provider if your symptoms do not start to get better or if they getworse. Do not take other medicines that contain aspirin, ibuprofen, or naproxen with this medicine. Side effects such as stomach upset, nausea, or ulcers may be more likely to occur. Many non-prescription medicines contain aspirin,ibuprofen, or naproxen. Always read labels carefully. This medicine can cause serious ulcers and bleeding in the stomach. It can happen with no warning. Smoking, drinking alcohol, older age, and poor health can also increase risks. Call your  health care provider right away if you havestomach pain or blood in your vomit  or stool. This medicine does not prevent a heart attack or stroke. This medicine may increase the chance of a heart attack or stroke. The chance may increase the longer you use this medicine or if you have heart disease. If you take aspirin to prevent a heart attack or stroke, talk to your health care provider aboutusing this medicine. Alcohol may interfere with the effect of this medicine. Avoid alcoholic drinks. This medicine may cause serious skin reactions. They can happen weeks to months after starting the medicine. Contact your health care provider right away if you notice fevers or flu-like symptoms with a rash. The rash may be red or purple and then turn into blisters or peeling of the skin. Or, you might notice a red rash with swelling of the face, lips or lymph nodes in your neck or underyour arms. Talk to your health care provider if you are pregnant before taking this medicine. Taking this medicine between weeks 20 and 30 of pregnancy may harm your unborn baby. Your health care provider will monitor you closely if youneed to take it. After 30 weeks of pregnancy, do not take this medicine. You may get drowsy or dizzy. Do not drive, use machinery, or do anything that needs mental alertness until you know how this medicine affects you. Do not stand up or sit up quickly, especially if you are an older patient. Thisreduces the risk of dizzy or fainting spells. Be careful brushing or flossing your teeth or using a toothpick because you may get an infection or bleed more easily. If you have any dental work done, Primary school teacher you are receiving this medicine. This medicine may make it more difficult to get pregnant. Talk to your healthcare provider if you are concerned about your fertility. What side effects may I notice from receiving this medication? Side effects that you should report to your doctor or health care provider assoon as possible: allergic reactions (skin rash, itching or hives;  swelling of the face, lips, or tongue) bleeding (bloody or black, tarry stools; red or dark brown urine; spitting up blood or brown material that looks like coffee grounds; red spots on the skin; unusual bruising or bleeding from the eyes, gums, or nose) blood clot (chest pain; shortness of breath; pain, swelling, or warmth in the leg) general ill feeling or flu-like symptoms high potassium levels (chest pain; fast, irregular heartbeat; muscle weakness) kidney injury (trouble passing urine or change in the amount of urine) light-colored stool liver injury (dark yellow or brown urine; general ill feeling or flu-like symptoms; loss of appetite, right upper belly pain; unusually weak or tired, yellowing of the eyes or skin) low red blood cell counts (trouble breathing; feeling faint; lightheaded, falls; unusually weak or tired) rash, fever, and swollen lymph nodes redness, blistering, peeling, or loosening of the skin, including inside the mouth stroke (changes in vision; confusion; trouble speaking or understanding; severe headaches; sudden numbness or weakness of the face, arm or leg; trouble walking; dizziness; loss of balance or coordination) Side effects that usually do not require medical attention (report to yourdoctor or health care provider if they continue or are bothersome): constipation diarrhea dizziness gas headache heartburn nausea, vomiting This list may not describe all possible side effects. Call your doctor for medical advice about side effects. You may report side effects to FDA at1-800-FDA-1088. Where should I keep my medication? Keep out of the reach of  children and pets. Store at room temperature between 20 and 25 degrees C (68 and 77 degrees F). Keep this drug in the original container. Protect from moisture. Keep thecontainer tightly closed. Get rid of any unused medicine after the expiration date. To get rid of medicines that are no longer needed or have expired: Take  the medicine to a medicine take-back program. Check with your pharmacy or law enforcement to find a location. If you cannot return the medicine, check the label or package insert to see if the medicine should be thrown out in the garbage or flushed down the toilet. If you are not sure, ask your health care provider. If it is safe to put it in the trash, empty the medicine out of the container. Mix the medicine with cat litter, dirt, coffee grounds, or other unwanted substance. Seal the mixture in a bag or container. Put it in the trash.  Erythrocyte Sedimentation Rate Test Why am I having this test? The erythrocyte sedimentation rate (ESR) test is used to help find illnesses related to: Sudden (acute) or long-term (chronic) infections. Inflammation. The body's disease-fighting system attacking healthy cells (autoimmune diseases). Cancer. Tissue death. If you have symptoms that may be related to any of these illnesses, your health care provider may do an ESR test before doing more specific tests. If you have an inflammatory immune disease, such as rheumatoid arthritis, you may have thistest to help monitor your therapy. What is being tested? This test measures how long it takes for your red blood cells (erythrocytes) to settle in a solution over a certain amount of time (sedimentation rate). When you have an infection or inflammation, your red blood cells clump together and settle faster. The sedimentation rate provides information abouthow much inflammation is present in the body. What kind of sample is taken?  A blood sample is required for this test. It is usually collected by insertinga needle into a blood vessel. How do I prepare for this test? Follow any instructions from your health care provider about changing orstopping your regular medicines. Tell a health care provider about: Any allergies you have. All medicines you are taking, including vitamins, herbs, eye drops, creams, and  over-the-counter medicines. Any blood disorders you have. Any surgeries you have had. Any medical conditions you have, such as thyroid or kidney disease. Whether you are pregnant or may be pregnant. How are the results reported? Your results will be reported as a value that measures sedimentation rate in millimeters per hour (mm/hr). Your health care provider will compare your results to normal ranges that were established after testing a large group of people (reference values). Reference values may vary among labs and hospitals. For this test, common reference values, which vary by age and gender, are: Newborn: 0-2 mm/hr. Child, up to puberty: 0-10 mm/hr. Female: Under 50 years: 0-20 mm/hr. 50-85 years: 0-30 mm/hr. Over 85 years: 0-42 mm/hr. Female: Under 50 years: 0-15 mm/hr. 50-85 years: 0-20 mm/hr. Over 85 years: 0-30 mm/hr. Certain conditions or medicines may cause ESR levels to be falsely lower or higher, such as: Pregnancy. Obesity. Steroids, birth control pills, and blood thinners. Thyroid or kidney disease. What do the results mean? Results that are within reference values are considered normal, meaning that the level of inflammation in your body is healthy. High ESR levels mean that there is inflammation in your body. You will have more tests to help make adiagnosis. Inflammation may result from many different conditions or injuries. Talk with your health  care provider about what your results mean. Questions to ask your health care provider Ask your health care provider, or the department that is doing the test: When will my results be ready? How will I get my results? What are my treatment options? What other tests do I need? What are my next steps? Summary The erythrocyte sedimentation rate (ESR) test is used to help find illnesses associated with sudden (acute) or long-term (chronic) infections, inflammation, autoimmune diseases, cancer, or tissue death. If you have  symptoms that may be related to any of these illnesses, your health care provider may do an ESR test before doing more specific tests. If you have an inflammatory immune disease, such as rheumatoid arthritis, you may have this test to help monitor your therapy. This test measures how long it takes for your red blood cells (erythrocytes) to settle in a solution over a certain amount of time (sedimentation rate). This provides information about how much inflammation is present in the body.  Rheumatoid Factor Test Why am I having this test? The rheumatoid factor test is used to help diagnose certain autoimmune diseases. Normally, your body makes protective proteins called antibodies (IgM, IgG, and IgA) to help fight off infections. If you have an autoimmune disease, your body may make a collection of antibodies that do not function correctly (autoantibodies). They attack tissues that are wrongly identified as foreign. In some autoimmunediseases, these autoantibodies are known as the rheumatoid factor (RF). You may have this test if your health care provider suspects that you have an autoimmune disease, such as: Rheumatoid arthritis (RA). Systemic lupus erythematosus (SLE). Sjgren's syndrome. Mixed connective tissue disease. A majority of people who have rheumatoid arthritis have a positive rheumatoid factor. Raised levels of these autoantibodies can also sometimes be a sign of other autoimmune diseases. However, it is also possible for the RF test to be negative even when a disease is present. Likewise, a small number of people may have a positive RF test when an autoimmune disease is not actually present.Other tests may be needed to help make a diagnosis. What is being tested? This test checks your blood for the RF autoantibodies. What kind of sample is taken?  A blood sample is required for this test. It is usually collected by insertinga needle into a blood vessel or by sticking a finger with a  small needle. How are the results reported? Your test result will be reported as either positive or negative for RFautoantibodies. What do the results mean? A negative result means that no RF or only a small amount was found in yourblood. This means that it is unlikely that you have an autoimmune disease. A positive result means that a larger amount of RF autoantibodies was found in your blood. This may indicate that you have RA or another autoimmune disease. Your health care provider will talk to you about doing more tests to confirmyour results. Talk with your health care provider about what your results mean. Questions to ask your health care provider Ask your health care provider, or the department that is doing the test: When will my results be ready? How will I get my results? What are my treatment options? What other tests do I need? What are my next steps? Summary The rheumatoid factor test is used to help diagnose certain autoimmune diseases. In some autoimmune diseases, the body makes autoantibodies known as the rheumatoid factor (RF), which attack tissues that are wrongly identified as foreign. A negative result means that no  RF or only a small amount was found in your blood. A positive result means that a larger amount of RF autoantibodies was found in your blood. Talk with your health care provider about what your results mean.  Antinuclear Antibody Test Why am I having this test? This is a test that is used to help diagnose systemic lupus erythematosus (SLE) and other autoimmune diseases. An autoimmune disease is a disease in which the body's own defense (immune)system attacks its organs. What is being tested? This test checks for antinuclear antibodies (ANA) in the blood. The presence of ANA is associated with several autoimmune diseases. It is seen in almost allpatients with lupus. What kind of sample is taken?  A blood sample is required for this test. It is usually  collected by insertinga needle into a blood vessel. How are the results reported? Your test results will be reported as either positive or negative. A false-positive result can occur. A false positive is incorrect because itmeans that a condition is present when it is not. What do the results mean? A positive test result may mean that you have: Lupus. Other autoimmune diseases, such as rheumatoid arthritis, scleroderma, or Sjgren syndrome. Conditions that may cause a false-positive result include: Liver dysfunction. Myasthenia gravis. Infectious mononucleosis. Talk with your health care provider about what your results mean. Questions to ask your health care provider Ask your health care provider, or the department that is doing the test: When will my results be ready? How will I get my results? What are my treatment options? What other tests do I need? What are my next steps? Summary This is a test that is used to help diagnose systemic lupus erythematosus (SLE) and other autoimmune diseases. An autoimmune disease is a disease in which the body's own defense (immune)system attacks the body. This test checks for antinuclear antibodies (ANA) in the blood. The presence of ANA is associated with several autoimmune diseases. It is seen in almost all patients with lupus. Your test results will be reported as either positive or negative. Talk with your health care provider about what your results mean. This information is not intended to replace advice given to you by your health care provider. Make sure you discuss any questions you have with your healthcare provider. Document Revised: 09/27/2019 Document Reviewed: 09/27/2019 [employer] Patient Education  [place].

## 2020-07-23 NOTE — Progress Notes (Signed)
Office Visit Note  Patient: Kaitlyn Stone             Date of Birth: 01/28/1976           MRN: [id number]             PCP: Rich Fuchs, PA Referring: Briscoe Deutscher, DO Visit Date: 07/23/2020  Subjective:  Right elbow pain, bilateral knee pains, chronic hip pains, positive ana   History of Present Illness: Kaitlyn Stone is a 45 y.o. female here for evaluation of chronic joint pains especially involving the right elbow and bilateral knees. The elbow pain she recalls starting suddenly noticing this after reaching behind her although denies any specific trauma or mechanism that would cause an injury. She also developed numbness and tingling type discomfort in her bilateral hands, with nerve conduction testing reportedly negative for evidence of nerve impingement. These symptoms have partially improved during the past 2 or 3 months. She does continue to have bilateral knee pain worst with movement particular such as standing from kneeling position, low seats, or climbing stairs. She notices some swelling in her fingers early in the morning that improves by midday, no appreciable swelling in her legs. She was previously seen by Dr. Kandice Robinsons at [facility] treated for MCTD. Findings at that time included positive ANA, RNP, and RF. No erosive joint disease was identified on xrays. No history of dactylitis, uveitis, no raynaud's symptoms reported. 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 Previous treatment includes hydroxychloroquine and meloxicam, physical therapy.  Labs reviewed 09/2014 ANA pos RNP 1.3 Centromere, dsDNA, Jp-1, scl-70, SSA, SSB, smith neg RF 14.6 CCP neg ESR 10 CRP 5.8 HBV neg HCV neg  Activities of Daily Living:  Patient reports morning stiffness for 30 minutes.   Patient Reports nocturnal pain.  Difficulty dressing/grooming: Denies Difficulty climbing stairs: Reports Difficulty getting out of chair: Reports Difficulty using hands for  taps, buttons, cutlery, and/or writing: Denies  Review of Systems  Constitutional:  Positive for fatigue.  HENT:  Negative for mouth sores, mouth dryness and nose dryness.   Eyes:  Positive for dryness. Negative for pain and itching.  Respiratory:  Negative for shortness of breath and difficulty breathing.   Cardiovascular:  Negative for chest pain and palpitations.  Gastrointestinal:  Negative for blood in stool, constipation and diarrhea.  Endocrine: Negative for increased urination.  Genitourinary:  Negative for difficulty urinating.  Musculoskeletal:  Positive for joint pain, joint pain, myalgias, morning stiffness, muscle tenderness and myalgias. Negative for joint swelling.  Skin:  Negative for color change, rash and redness.  Allergic/Immunologic: Negative for susceptible to infections.  Neurological:  Positive for numbness and weakness. Negative for dizziness, headaches and memory loss.  Hematological:  Positive for bruising/bleeding tendency.  Psychiatric/Behavioral:  Negative for confusion.    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   Pain in right elbow 07/23/2020   Mixed hyperlipidemia 03/14/2018   Class 1 obesity due to excess calories without serious comorbidity with body mass index (BMI) of 31.0 to 31.9 in adult 10/18/2017   History of irregular heartbeat 10/18/2017   Hypothyroidism 10/18/2017   Palpitations 09/08/2014    Past Medical History:  Diagnosis Date   Back pain    Hyperlipidemia    Hypothyroidism    Joint pain    Rheumatoid arthritis ([place])     Family History  Problem Relation Age of Onset   Heart disease Mother    Cancer Mother    Heart disease Father    Diabetes Brother    Healthy Daughter    Polycystic ovary syndrome Daughter    Healthy Daughter    Healthy Daughter    Past Surgical History:  Procedure Laterality Date    EYE SURGERY Bilateral    x2   HIP SURGERY Right    WRIST SURGERY Right    Social History   Social History Narrative   Not on file   Immunization History  Administered Date(s) Administered   Influenza,inj,Quad PF,6+ Mos 11/15/2017   Tdap 03/11/2016     Objective: Vital Signs: BP 112/82 (BP Location: Right Arm, Patient Position: Sitting, Cuff Size: Normal)   Pulse 70   Resp 14   Ht 5' 3"  (1.6 m)   Wt 177 lb (80.3 kg)   BMI 31.35 kg/m    Physical Exam HENT:     Right Ear: External ear normal.     Left Ear: External ear normal.     Mouth/Throat:     Mouth: Mucous membranes are moist.     Pharynx: Oropharynx is clear.  Eyes:     Conjunctiva/sclera: Conjunctivae normal.  Cardiovascular:     Rate and Rhythm: Normal rate and regular rhythm.  Pulmonary:     Effort: Pulmonary effort is normal.     Breath sounds: Normal breath sounds.  Musculoskeletal:     Right lower leg: No edema.     Left lower leg: No edema.  Skin:    General: Skin is warm and dry.  Neurological:     General: No focal deficit present.     Mental Status: She is alert.  Psychiatric:        Mood and Affect: Mood normal.     Musculoskeletal Exam:  Shoulders full ROM no tenderness or swelling Elbows full ROM, right elbow tenderness worst between lateral epicondyle and olecranon, no cubital tunnel tenderness, no palpable swelling Wrists full ROM no tenderness or swelling Fingers full ROM no tenderness or swelling Left hip internal and external rotation ROM is decreased compared to right Knees full ROM, reports anterior pain with passive and active ROM, no joint line tenderness, no effusion Ankles full ROM no tenderness or swelling   Investigation: No additional findings.  Imaging: No results found.  Recent Labs: Lab Results  Component Value Date   WBC 5.4 05/28/2020   HGB 15.3 05/28/2020   PLT 235 05/28/2020   NA 144 05/28/2020   K 4.5 05/28/2020   CL 103 05/28/2020   CO2 21 05/28/2020    GLUCOSE 88 05/28/2020   BUN 15 05/28/2020   CREATININE 1.05 (H) 05/28/2020   BILITOT 0.4 05/28/2020   ALKPHOS 70 05/28/2020   AST 16 05/28/2020   ALT 16 05/28/2020   PROT 7.3 05/28/2020   ALBUMIN 4.9 (H) 05/28/2020   CALCIUM 9.5 05/28/2020    Speciality Comments: No specialty comments available.  Procedures:  No procedures performed Allergies: Morphine and related   Assessment / Plan:     Visit Diagnoses: Positive ANA (antinuclear antibody) - Plan: Rheumatoid factor, Sedimentation rate, C-reactive protein, ANA, RNP Antibody, Sjogrens syndrome-B extractable nuclear antibody, Sjogrens syndrome-A extractable nuclear antibody, Anti-DNA antibody, double-stranded, Anti-Smith antibody  Arthralgias without other specific clinical features of systemic connective tissue disease at this time, positive serology reviewed. Will repeat ENA titers today, previously were low but several years ago. Also checking RF Abs and inflammatory markers.  If these are unremarkable would not start treatment today but would follow up, if positive would consider trial of DMARD treatment.  Chondromalacia of both patellae  Bilateral substantial crepitus and anterior knee pain with far flexion ROM. No effusions or inflammatory changes seen today. Discussed NSAIDs are currently first line option for this if not tolerable or getting worse can consider additional treatment options.  Pain in right elbow  No specific changes seen on exam today, possible injury versus inflammatory joint pain. Currently normal ROM and strength so no additional workup for this today.  Orders: Orders Placed This Encounter  Procedures   Rheumatoid factor   Sedimentation rate   C-reactive protein   ANA   RNP Antibody   Sjogrens syndrome-B extractable nuclear antibody   Sjogrens syndrome-A extractable nuclear antibody   Anti-DNA antibody, double-stranded   Anti-Smith antibody    No orders of the defined types were placed in this  encounter.   Follow-Up Instructions: No follow-ups on file.   Collier Salina, MD  Note - This record has been created using [employer].  Chart creation errors have been sought, but may not always  have been located. Such creation errors do not reflect on  the standard of medical care.

## 2020-07-24 LAB — SJOGRENS SYNDROME-A EXTRACTABLE NUCLEAR ANTIBODY: SSA (Ro) (ENA) Antibody, IgG: <1 AI

## 2020-07-24 LAB — RNP ANTIBODY: Ribonucleic Protein(ENA) Antibody, IgG: 1.2 AI — AB

## 2020-07-24 LAB — RHEUMATOID FACTOR: Rheumatoid fact SerPl-aCnc: <14 IU/mL (ref <14)

## 2020-07-24 LAB — SEDIMENTATION RATE: Sed Rate: 6 mm/h (ref 0–20)

## 2020-07-24 LAB — SJOGRENS SYNDROME-B EXTRACTABLE NUCLEAR ANTIBODY: SSB (La) (ENA) Antibody, IgG: <1 AI

## 2020-07-24 LAB — ANTI-DNA ANTIBODY, DOUBLE-STRANDED: ds DNA Ab: 5 IU/mL — ABNORMAL HIGH

## 2020-07-24 LAB — C-REACTIVE PROTEIN: CRP: 5 mg/L (ref <8.0)

## 2020-07-24 LAB — ANTI-SMITH ANTIBODY: ENA SM Ab Ser-aCnc: <1 AI

## 2020-07-24 LAB — ANA: Anti Nuclear Antibody (ANA): NEGATIVE

## 2020-07-29 NOTE — Progress Notes (Signed)
Lab tests show very weakly positive antibody tests, the same one as previously seen at last rheumatology workup. Blood test markers for inflammation are negative currently. I do not recommend that we start any new medicines to suppress an autoimmune problem at this time. ] She can continue to use NSAIDs medication for joint pains especially the knees like we discussed. If this gets worse or has more joint swelling type problems we can take another look. Options would include a joint injection as well if suddenly much worse too.

## 2020-08-05 ENCOUNTER — Ambulatory Visit (INDEPENDENT_AMBULATORY_CARE_PROVIDER_SITE_OTHER): Payer: BC Managed Care – PPO | Admitting: Physician Assistant

## 2020-08-10 ENCOUNTER — Other Ambulatory Visit (INDEPENDENT_AMBULATORY_CARE_PROVIDER_SITE_OTHER): Payer: Self-pay | Admitting: Family Medicine

## 2020-08-10 DIAGNOSIS — E559 Vitamin D deficiency, unspecified: Secondary | ICD-10-CM

## 2020-08-17 ENCOUNTER — Encounter (INDEPENDENT_AMBULATORY_CARE_PROVIDER_SITE_OTHER): Payer: Self-pay | Admitting: Family Medicine

## 2020-08-18 DIAGNOSIS — R070 Pain in throat: Secondary | ICD-10-CM | POA: Diagnosis not present

## 2020-08-19 ENCOUNTER — Ambulatory Visit (INDEPENDENT_AMBULATORY_CARE_PROVIDER_SITE_OTHER): Payer: BC Managed Care – PPO | Admitting: Family Medicine

## 2020-08-31 ENCOUNTER — Other Ambulatory Visit (INDEPENDENT_AMBULATORY_CARE_PROVIDER_SITE_OTHER): Payer: Self-pay | Admitting: Family Medicine

## 2020-08-31 DIAGNOSIS — E559 Vitamin D deficiency, unspecified: Secondary | ICD-10-CM

## 2020-08-31 NOTE — Telephone Encounter (Signed)
Dr.Wallace °

## 2020-09-11 ENCOUNTER — Encounter (INDEPENDENT_AMBULATORY_CARE_PROVIDER_SITE_OTHER): Payer: Self-pay | Admitting: Family Medicine

## 2020-09-14 DIAGNOSIS — M5412 Radiculopathy, cervical region: Secondary | ICD-10-CM | POA: Diagnosis not present

## 2020-09-15 ENCOUNTER — Ambulatory Visit (INDEPENDENT_AMBULATORY_CARE_PROVIDER_SITE_OTHER): Payer: BC Managed Care – PPO | Admitting: Family Medicine

## 2020-09-28 DIAGNOSIS — M542 Cervicalgia: Secondary | ICD-10-CM | POA: Diagnosis not present

## 2020-10-09 ENCOUNTER — Other Ambulatory Visit (INDEPENDENT_AMBULATORY_CARE_PROVIDER_SITE_OTHER): Payer: Self-pay | Admitting: Family Medicine

## 2020-10-09 DIAGNOSIS — M069 Rheumatoid arthritis, unspecified: Secondary | ICD-10-CM

## 2020-10-13 DIAGNOSIS — M40293 Other kyphosis, cervicothoracic region: Secondary | ICD-10-CM | POA: Diagnosis not present

## 2020-10-13 DIAGNOSIS — M542 Cervicalgia: Secondary | ICD-10-CM | POA: Diagnosis not present

## 2020-10-13 NOTE — Telephone Encounter (Signed)
Dr.Wallace °

## 2020-10-16 DIAGNOSIS — M40293 Other kyphosis, cervicothoracic region: Secondary | ICD-10-CM | POA: Diagnosis not present

## 2020-10-16 DIAGNOSIS — M542 Cervicalgia: Secondary | ICD-10-CM | POA: Diagnosis not present

## 2020-10-19 DIAGNOSIS — M40293 Other kyphosis, cervicothoracic region: Secondary | ICD-10-CM | POA: Diagnosis not present

## 2020-10-19 DIAGNOSIS — M542 Cervicalgia: Secondary | ICD-10-CM | POA: Diagnosis not present

## 2020-10-22 DIAGNOSIS — M542 Cervicalgia: Secondary | ICD-10-CM | POA: Diagnosis not present

## 2020-10-22 DIAGNOSIS — M40293 Other kyphosis, cervicothoracic region: Secondary | ICD-10-CM | POA: Diagnosis not present

## 2020-10-26 DIAGNOSIS — M542 Cervicalgia: Secondary | ICD-10-CM | POA: Diagnosis not present

## 2020-10-26 DIAGNOSIS — M40293 Other kyphosis, cervicothoracic region: Secondary | ICD-10-CM | POA: Diagnosis not present

## 2020-10-29 DIAGNOSIS — M542 Cervicalgia: Secondary | ICD-10-CM | POA: Diagnosis not present

## 2020-10-29 DIAGNOSIS — M40293 Other kyphosis, cervicothoracic region: Secondary | ICD-10-CM | POA: Diagnosis not present

## 2020-11-02 DIAGNOSIS — M40293 Other kyphosis, cervicothoracic region: Secondary | ICD-10-CM | POA: Diagnosis not present

## 2020-11-02 DIAGNOSIS — M542 Cervicalgia: Secondary | ICD-10-CM | POA: Diagnosis not present

## 2020-11-04 DIAGNOSIS — M542 Cervicalgia: Secondary | ICD-10-CM | POA: Diagnosis not present

## 2020-11-05 DIAGNOSIS — M542 Cervicalgia: Secondary | ICD-10-CM | POA: Diagnosis not present

## 2020-11-05 DIAGNOSIS — M40293 Other kyphosis, cervicothoracic region: Secondary | ICD-10-CM | POA: Diagnosis not present

## 2020-11-09 DIAGNOSIS — M40293 Other kyphosis, cervicothoracic region: Secondary | ICD-10-CM | POA: Diagnosis not present

## 2020-11-09 DIAGNOSIS — M542 Cervicalgia: Secondary | ICD-10-CM | POA: Diagnosis not present

## 2020-11-12 DIAGNOSIS — M542 Cervicalgia: Secondary | ICD-10-CM | POA: Diagnosis not present

## 2020-11-12 DIAGNOSIS — M40293 Other kyphosis, cervicothoracic region: Secondary | ICD-10-CM | POA: Diagnosis not present

## 2020-11-16 DIAGNOSIS — M40293 Other kyphosis, cervicothoracic region: Secondary | ICD-10-CM | POA: Diagnosis not present

## 2020-11-16 DIAGNOSIS — M542 Cervicalgia: Secondary | ICD-10-CM | POA: Diagnosis not present

## 2020-11-20 DIAGNOSIS — Z131 Encounter for screening for diabetes mellitus: Secondary | ICD-10-CM | POA: Diagnosis not present

## 2020-11-20 DIAGNOSIS — E782 Mixed hyperlipidemia: Secondary | ICD-10-CM | POA: Diagnosis not present

## 2020-11-20 DIAGNOSIS — E559 Vitamin D deficiency, unspecified: Secondary | ICD-10-CM | POA: Diagnosis not present

## 2020-11-20 DIAGNOSIS — E6609 Other obesity due to excess calories: Secondary | ICD-10-CM | POA: Diagnosis not present

## 2020-11-20 DIAGNOSIS — E039 Hypothyroidism, unspecified: Secondary | ICD-10-CM | POA: Diagnosis not present

## 2020-11-20 DIAGNOSIS — Z130 Encounter for screening for diseases of the blood and blood-forming organs and certain disorders involving the immune mechanism: Secondary | ICD-10-CM | POA: Diagnosis not present

## 2020-12-20 DIAGNOSIS — R000 Tachycardia, unspecified: Secondary | ICD-10-CM | POA: Diagnosis not present

## 2020-12-20 DIAGNOSIS — R002 Palpitations: Secondary | ICD-10-CM | POA: Diagnosis not present

## 2020-12-20 DIAGNOSIS — R0602 Shortness of breath: Secondary | ICD-10-CM | POA: Diagnosis not present

## 2020-12-21 DIAGNOSIS — Z1231 Encounter for screening mammogram for malignant neoplasm of breast: Secondary | ICD-10-CM | POA: Diagnosis not present

## 2020-12-24 DIAGNOSIS — E039 Hypothyroidism, unspecified: Secondary | ICD-10-CM | POA: Diagnosis not present

## 2020-12-24 DIAGNOSIS — N289 Disorder of kidney and ureter, unspecified: Secondary | ICD-10-CM | POA: Diagnosis not present

## 2020-12-24 DIAGNOSIS — R002 Palpitations: Secondary | ICD-10-CM | POA: Diagnosis not present

## 2020-12-30 DIAGNOSIS — R002 Palpitations: Secondary | ICD-10-CM | POA: Diagnosis not present

## 2020-12-30 DIAGNOSIS — E039 Hypothyroidism, unspecified: Secondary | ICD-10-CM | POA: Diagnosis not present

## 2021-01-02 DIAGNOSIS — R002 Palpitations: Secondary | ICD-10-CM | POA: Diagnosis not present

## 2021-01-15 DIAGNOSIS — R002 Palpitations: Secondary | ICD-10-CM | POA: Diagnosis not present

## 2021-02-05 DIAGNOSIS — R002 Palpitations: Secondary | ICD-10-CM | POA: Diagnosis not present

## 2021-02-09 DIAGNOSIS — R002 Palpitations: Secondary | ICD-10-CM | POA: Diagnosis not present

## 2021-03-22 DIAGNOSIS — H9311 Tinnitus, right ear: Secondary | ICD-10-CM | POA: Diagnosis not present

## 2021-03-25 ENCOUNTER — Other Ambulatory Visit: Payer: Self-pay | Admitting: Otolaryngology

## 2021-03-25 DIAGNOSIS — H9319 Tinnitus, unspecified ear: Secondary | ICD-10-CM

## 2021-04-02 DIAGNOSIS — R002 Palpitations: Secondary | ICD-10-CM | POA: Diagnosis not present

## 2021-04-04 ENCOUNTER — Other Ambulatory Visit: Payer: BC Managed Care – PPO

## 2021-04-10 ENCOUNTER — Ambulatory Visit
Admission: RE | Admit: 2021-04-10 | Discharge: 2021-04-10 | Disposition: A | Discharge status: Home / Self Care | Payer: BC Managed Care – PPO | Source: Ambulatory Visit | Attending: Otolaryngology | Admitting: Otolaryngology

## 2021-04-10 ENCOUNTER — Other Ambulatory Visit: Payer: Self-pay

## 2021-04-10 DIAGNOSIS — H9319 Tinnitus, unspecified ear: Secondary | ICD-10-CM

## 2021-04-10 MED ORDER — GADOBENATE DIMEGLUMINE 529 MG/ML IV SOLN
15.0000 mL | Freq: Once | INTRAVENOUS | Status: AC | PRN
  Administered 2021-04-10: 15 mL via INTRAVENOUS

## 2021-04-19 DIAGNOSIS — M542 Cervicalgia: Secondary | ICD-10-CM | POA: Diagnosis not present

## 2021-04-21 ENCOUNTER — Other Ambulatory Visit: Payer: Self-pay | Admitting: Otolaryngology

## 2021-04-21 DIAGNOSIS — H93A2 Pulsatile tinnitus, left ear: Secondary | ICD-10-CM

## 2021-04-30 ENCOUNTER — Ambulatory Visit
Admission: RE | Admit: 2021-04-30 | Discharge: 2021-04-30 | Disposition: A | Discharge status: Home / Self Care | Payer: BC Managed Care – PPO | Source: Ambulatory Visit | Attending: Otolaryngology | Admitting: Otolaryngology

## 2021-04-30 DIAGNOSIS — H9311 Tinnitus, right ear: Secondary | ICD-10-CM | POA: Diagnosis not present

## 2021-04-30 DIAGNOSIS — H93A2 Pulsatile tinnitus, left ear: Secondary | ICD-10-CM

## 2021-04-30 MED ORDER — IOPAMIDOL (ISOVUE-370) INJECTION 76%
75.0000 mL | Freq: Once | INTRAVENOUS | Status: AC | PRN
  Administered 2021-04-30: 75 mL via INTRAVENOUS

## 2021-09-15 ENCOUNTER — Encounter (INDEPENDENT_AMBULATORY_CARE_PROVIDER_SITE_OTHER): Payer: Self-pay

## 2021-11-12 DIAGNOSIS — J22 Unspecified acute lower respiratory infection: Secondary | ICD-10-CM | POA: Diagnosis not present

## 2021-11-12 DIAGNOSIS — R058 Other specified cough: Secondary | ICD-10-CM | POA: Diagnosis not present

## 2022-01-06 DIAGNOSIS — R92333 Mammographic heterogeneous density, bilateral breasts: Secondary | ICD-10-CM | POA: Diagnosis not present

## 2022-01-06 DIAGNOSIS — Z1231 Encounter for screening mammogram for malignant neoplasm of breast: Secondary | ICD-10-CM | POA: Diagnosis not present

## 2022-02-18 DIAGNOSIS — H9311 Tinnitus, right ear: Secondary | ICD-10-CM | POA: Diagnosis not present

## 2022-04-01 DIAGNOSIS — R002 Palpitations: Secondary | ICD-10-CM | POA: Diagnosis not present

## 2022-04-01 DIAGNOSIS — I491 Atrial premature depolarization: Secondary | ICD-10-CM | POA: Diagnosis not present

## 2022-04-01 DIAGNOSIS — I493 Ventricular premature depolarization: Secondary | ICD-10-CM | POA: Diagnosis not present

## 2022-04-01 DIAGNOSIS — E039 Hypothyroidism, unspecified: Secondary | ICD-10-CM | POA: Diagnosis not present

## 2022-04-18 ENCOUNTER — Telehealth: Payer: Self-pay | Admitting: Physician Assistant

## 2022-04-18 NOTE — Telephone Encounter (Signed)
Patient states Dr. Nani Ravens sees her husband Sagrario Gensheimer, and she would like to become a new patient so they can both have the same PCP. Is this ok?

## 2022-05-03 ENCOUNTER — Other Ambulatory Visit (HOSPITAL_BASED_OUTPATIENT_CLINIC_OR_DEPARTMENT_OTHER): Payer: Self-pay

## 2022-05-03 ENCOUNTER — Encounter: Payer: Self-pay | Admitting: Family Medicine

## 2022-05-03 ENCOUNTER — Ambulatory Visit: Payer: BC Managed Care – PPO | Admitting: Family Medicine

## 2022-05-03 VITALS — BP 122/80 | HR 79 | Temp 98.4°F | Ht 62.0 in | Wt 202.0 lb

## 2022-05-03 DIAGNOSIS — E039 Hypothyroidism, unspecified: Secondary | ICD-10-CM | POA: Diagnosis not present

## 2022-05-03 DIAGNOSIS — Z0000 Encounter for general adult medical examination without abnormal findings: Secondary | ICD-10-CM | POA: Diagnosis not present

## 2022-05-03 DIAGNOSIS — E669 Obesity, unspecified: Secondary | ICD-10-CM | POA: Diagnosis not present

## 2022-05-03 DIAGNOSIS — Z1211 Encounter for screening for malignant neoplasm of colon: Secondary | ICD-10-CM | POA: Diagnosis not present

## 2022-05-03 DIAGNOSIS — Z0001 Encounter for general adult medical examination with abnormal findings: Secondary | ICD-10-CM | POA: Diagnosis not present

## 2022-05-03 MED ORDER — ZEPBOUND 5 MG/0.5ML ~~LOC~~ SOAJ
5.0000 mg | SUBCUTANEOUS | 0 refills | Status: DC
  Filled 2022-05-03 – 2022-06-16 (×3): qty 2, 28d supply, fill #0

## 2022-05-03 MED ORDER — ZEPBOUND 7.5 MG/0.5ML ~~LOC~~ SOAJ
7.5000 mg | SUBCUTANEOUS | 0 refills | Status: DC
  Filled 2022-05-03: qty 2, 28d supply, fill #0

## 2022-05-03 MED ORDER — ZEPBOUND 2.5 MG/0.5ML ~~LOC~~ SOAJ
2.5000 mg | SUBCUTANEOUS | 0 refills | Status: DC
  Filled 2022-05-03: qty 2, 28d supply, fill #0

## 2022-05-03 NOTE — Patient Instructions (Addendum)
Give [place] 2-3 business days to get the results of your labs back.   Keep the diet clean and stay active.   Aim to do some physical exertion for 150 minutes per week. This is typically divided into 5 days per week, 30 minutes per day. The activity should be enough to get your heart rate up. Anything is better than nothing if you have time constraints. Consider adding weight resistance exercise to your routine.   Wait a few weeks before starting the Zepbound to ensure the next dosage is available.   Please get me a copy of your advanced directive form at your convenience.   If you do not hear anything about your referral in the next 1-2 weeks, call our office and ask for an update.  Let us know if you need anything.

## 2022-05-03 NOTE — Progress Notes (Signed)
Chief Complaint  Patient presents with   New Patient (Initial Visit)     Well Woman Kaitlyn Stone is here for a complete physical.   Her last physical was >1 year ago.  Current diet: in general, a "healthy" diet. Current exercise: walking. Weight is stable and she denies fatigue out of ordinary. Seatbelt? Yes Advanced directive? No  Health Maintenance Pap/HPV- Yes Mammogram- Yes Tetanus- Yes Hep C screening- Yes HIV screening- Yes CCS- No  Obesity Patient has a history of obesity.  She has tried improving her diet and even reducing her daily caloric intake to 1000.  She has seen the medical weight loss clinic without much success.  She has never been on a medication to help with this.  Her husband was originally prescribed Zepbound which has worked very well for him and she is wondering if she could get this.  Diet/exercise as above.  Past Medical History:  Diagnosis Date   Hyperlipidemia    Hypothyroidism    Rheumatoid arthritis ([place])      Past Surgical History:  Procedure Laterality Date   EYE SURGERY Bilateral    x2   HIP SURGERY Right    WRIST SURGERY Right     Medications  Current Outpatient Medications on File Prior to Visit  Medication Sig Dispense Refill   meloxicam (MOBIC) 15 MG tablet Take 1 tablet (15 mg total) by mouth daily. 90 tablet 0   Allergies Allergies  Allergen Reactions   Morphine And Related Rash    Review of Systems: Constitutional:  no unexpected weight changes Eye:  no recent significant change in vision Ear/Nose/Mouth/Throat:  Ears:  no recent change in hearing Nose/Mouth/Throat:  no complaints of nasal congestion, no sore throat Cardiovascular: no chest pain Respiratory:  no shortness of breath Gastrointestinal:  no abdominal pain, no change in bowel habits GU:  Female: negative for dysuria or pelvic pain Musculoskeletal/Extremities:  no pain of the joints Integumentary (Skin/Breast):  no abnormal skin lesions  reported Neurologic:  no headaches Endocrine:  denies fatigue Hematologic/Lymphatic:  No areas of easy bleeding  Exam BP 122/80 (BP Location: Left Arm, Patient Position: Sitting, Cuff Size: Large)   Pulse 79   Temp 98.4 F (36.9 C) (Oral)   Ht 5\' 2"  (1.575 m)   Wt 202 lb (91.6 kg)   SpO2 98%   BMI 36.95 kg/m  General:  well developed, well nourished, in no apparent distress Skin:  no significant moles, warts, or growths Head:  no masses, lesions, or tenderness Eyes:  pupils equal and round, sclera anicteric without injection Ears:  canals without lesions, TMs shiny without retraction, no obvious effusion, no erythema Nose:  nares patent, mucosa normal, and no drainage Throat/Pharynx:  lips and gingiva without lesion; tongue and uvula midline; non-inflamed pharynx; no exudates or postnasal drainage Neck: neck supple without adenopathy, thyromegaly, or masses Lungs:  clear to auscultation, breath sounds equal bilaterally, no respiratory distress Cardio:  regular rate and rhythm, no LE edema Abdomen:  abdomen soft, nontender; bowel sounds normal; no masses or organomegaly Genital: Defer to GYN Musculoskeletal:  symmetrical muscle groups noted without atrophy or deformity Extremities:  no clubbing, cyanosis, or edema, no deformities, no skin discoloration Neuro:  gait normal; deep tendon reflexes normal and symmetric Psych: well oriented with normal range of affect and appropriate judgment/insight  Assessment and Plan  Well adult exam - Plan: CBC, Comprehensive metabolic panel, Lipid panel  Hypothyroidism, unspecified type - Plan: TSH, T4, free  Obesity (BMI 30-39.9) -  Plan: tirzepatide (ZEPBOUND) 2.5 MG/0.5ML Pen, tirzepatide (ZEPBOUND) 5 MG/0.5ML Pen, tirzepatide (ZEPBOUND) 7.5 MG/0.5ML Pen  Screen for colon cancer - Plan: Ambulatory referral to Gastroenterology   Well 47 y.o. female. Counseled on diet and exercise. Advanced directive form provided today.  CCS: Refer GI  today. Other orders as above. Obesity: Chronic, uncontrolled. Start Zepbound 2.5 mg/week and up-titrate. Hold for a few weeks to ensure next dosage available. Follow up in 7 weeks. The patient voiced understanding and agreement to the plan.  [name], DO 05/03/22 2:10 PM \

## 2022-05-04 ENCOUNTER — Other Ambulatory Visit: Payer: Self-pay | Admitting: Family Medicine

## 2022-05-04 ENCOUNTER — Other Ambulatory Visit (HOSPITAL_BASED_OUTPATIENT_CLINIC_OR_DEPARTMENT_OTHER): Payer: Self-pay

## 2022-05-04 DIAGNOSIS — R799 Abnormal finding of blood chemistry, unspecified: Secondary | ICD-10-CM

## 2022-05-04 LAB — COMPREHENSIVE METABOLIC PANEL
ALT: 33 U/L (ref 0–35)
AST: 24 U/L (ref 0–37)
Albumin: 4.7 g/dL (ref 3.5–5.2)
Alkaline Phosphatase: 69 U/L (ref 39–117)
BUN: 14 mg/dL (ref 6–23)
CO2: 26 mEq/L (ref 19–32)
Calcium: 9.6 mg/dL (ref 8.4–10.5)
Chloride: 103 mEq/L (ref 96–112)
Creatinine, Ser: 1.05 mg/dL (ref 0.40–1.20)
GFR: 63.21 mL/min (ref >60.00)
Glucose, Bld: 87 mg/dL (ref 70–99)
Potassium: 4.2 mEq/L (ref 3.5–5.1)
Sodium: 139 mEq/L (ref 135–145)
Total Bilirubin: 0.4 mg/dL (ref 0.2–1.2)
Total Protein: 7.4 g/dL (ref 6.0–8.3)

## 2022-05-04 LAB — CBC
HCT: 46.3 % — ABNORMAL HIGH (ref 36.0–46.0)
Hemoglobin: 15.4 g/dL — ABNORMAL HIGH (ref 12.0–15.0)
MCHC: 33.4 g/dL (ref 30.0–36.0)
MCV: 86.3 fl (ref 78.0–100.0)
Platelets: 304 10*3/uL (ref 150.0–400.0)
RBC: 5.36 Mil/uL — ABNORMAL HIGH (ref 3.87–5.11)
RDW: 14.2 % (ref 11.5–15.5)
WBC: 6.2 10*3/uL (ref 4.0–10.5)

## 2022-05-04 LAB — LIPID PANEL
Cholesterol: 275 mg/dL — ABNORMAL HIGH (ref 0–200)
HDL: 57.9 mg/dL (ref >39.00)
LDL Cholesterol: 187 mg/dL — ABNORMAL HIGH (ref 0–99)
NonHDL: 217.36
Total CHOL/HDL Ratio: 5
Triglycerides: 152 mg/dL — ABNORMAL HIGH (ref 0.0–149.0)
VLDL: 30.4 mg/dL (ref 0.0–40.0)

## 2022-05-04 LAB — T4, FREE: Free T4: 1 ng/dL (ref 0.60–1.60)

## 2022-05-04 LAB — TSH: TSH: 2.07 u[IU]/mL (ref 0.35–5.50)

## 2022-05-05 ENCOUNTER — Telehealth: Payer: Self-pay | Admitting: *Deleted

## 2022-05-05 NOTE — Telephone Encounter (Signed)
Prior auth started via cover my meds.  Awaiting determination.  Key: [id number]

## 2022-05-06 ENCOUNTER — Other Ambulatory Visit (HOSPITAL_BASED_OUTPATIENT_CLINIC_OR_DEPARTMENT_OTHER): Payer: Self-pay

## 2022-05-10 ENCOUNTER — Other Ambulatory Visit (HOSPITAL_BASED_OUTPATIENT_CLINIC_OR_DEPARTMENT_OTHER): Payer: Self-pay

## 2022-05-10 NOTE — Telephone Encounter (Signed)
Approved on April 1 Approved. Authorization Expiration Date: 09/12/2022

## 2022-05-18 ENCOUNTER — Other Ambulatory Visit (INDEPENDENT_AMBULATORY_CARE_PROVIDER_SITE_OTHER): Payer: BC Managed Care – PPO

## 2022-05-18 DIAGNOSIS — R799 Abnormal finding of blood chemistry, unspecified: Secondary | ICD-10-CM

## 2022-05-19 LAB — CBC
HCT: 44.1 % (ref 36.0–46.0)
Hemoglobin: 14.9 g/dL (ref 12.0–15.0)
MCHC: 33.8 g/dL (ref 30.0–36.0)
MCV: 85.2 fl (ref 78.0–100.0)
Platelets: 298 10*3/uL (ref 150.0–400.0)
RBC: 5.17 Mil/uL — ABNORMAL HIGH (ref 3.87–5.11)
RDW: 14.1 % (ref 11.5–15.5)
WBC: 6.6 10*3/uL (ref 4.0–10.5)

## 2022-05-19 LAB — ERYTHROPOIETIN: Erythropoietin: 11.3 m[IU]/mL (ref 2.6–18.5)

## 2022-06-06 ENCOUNTER — Other Ambulatory Visit (HOSPITAL_BASED_OUTPATIENT_CLINIC_OR_DEPARTMENT_OTHER): Payer: Self-pay

## 2022-06-07 ENCOUNTER — Other Ambulatory Visit (HOSPITAL_BASED_OUTPATIENT_CLINIC_OR_DEPARTMENT_OTHER): Payer: Self-pay

## 2022-06-08 ENCOUNTER — Other Ambulatory Visit (HOSPITAL_BASED_OUTPATIENT_CLINIC_OR_DEPARTMENT_OTHER): Payer: Self-pay

## 2022-06-09 ENCOUNTER — Other Ambulatory Visit: Payer: Self-pay | Admitting: Family Medicine

## 2022-06-09 ENCOUNTER — Other Ambulatory Visit (HOSPITAL_BASED_OUTPATIENT_CLINIC_OR_DEPARTMENT_OTHER): Payer: Self-pay

## 2022-06-09 DIAGNOSIS — E669 Obesity, unspecified: Secondary | ICD-10-CM

## 2022-06-09 MED ORDER — ZEPBOUND 2.5 MG/0.5ML ~~LOC~~ SOAJ
2.5000 mg | SUBCUTANEOUS | 0 refills | Status: DC
  Filled 2022-06-09: qty 2, 28d supply, fill #0

## 2022-06-10 ENCOUNTER — Other Ambulatory Visit (HOSPITAL_BASED_OUTPATIENT_CLINIC_OR_DEPARTMENT_OTHER): Payer: Self-pay

## 2022-06-10 ENCOUNTER — Other Ambulatory Visit: Payer: Self-pay | Admitting: Family Medicine

## 2022-06-10 ENCOUNTER — Encounter: Payer: Self-pay | Admitting: Family Medicine

## 2022-06-10 MED ORDER — ZEPBOUND 2.5 MG/0.5ML ~~LOC~~ SOAJ
2.5000 mg | SUBCUTANEOUS | 0 refills | Status: DC
Start: 2022-06-10 — End: 2022-06-22

## 2022-06-13 ENCOUNTER — Other Ambulatory Visit (HOSPITAL_BASED_OUTPATIENT_CLINIC_OR_DEPARTMENT_OTHER): Payer: Self-pay

## 2022-06-14 ENCOUNTER — Other Ambulatory Visit: Payer: Self-pay | Admitting: Family Medicine

## 2022-06-14 ENCOUNTER — Other Ambulatory Visit (HOSPITAL_BASED_OUTPATIENT_CLINIC_OR_DEPARTMENT_OTHER): Payer: Self-pay

## 2022-06-16 ENCOUNTER — Other Ambulatory Visit (HOSPITAL_BASED_OUTPATIENT_CLINIC_OR_DEPARTMENT_OTHER): Payer: Self-pay

## 2022-06-20 ENCOUNTER — Ambulatory Visit: Payer: BC Managed Care – PPO | Admitting: Family Medicine

## 2022-06-22 ENCOUNTER — Other Ambulatory Visit (HOSPITAL_BASED_OUTPATIENT_CLINIC_OR_DEPARTMENT_OTHER): Payer: Self-pay

## 2022-06-22 ENCOUNTER — Ambulatory Visit: Payer: BC Managed Care – PPO | Admitting: Family Medicine

## 2022-06-22 ENCOUNTER — Encounter: Payer: Self-pay | Admitting: Family Medicine

## 2022-06-22 VITALS — BP 120/84 | HR 79 | Temp 98.6°F | Ht 62.0 in | Wt 193.0 lb

## 2022-06-22 DIAGNOSIS — E669 Obesity, unspecified: Secondary | ICD-10-CM | POA: Diagnosis not present

## 2022-06-22 MED ORDER — ZEPBOUND 5 MG/0.5ML ~~LOC~~ SOAJ
5.0000 mg | SUBCUTANEOUS | 5 refills | Status: DC
  Filled 2022-06-22 – 2022-07-07 (×4): qty 2, 28d supply, fill #0
  Filled 2022-08-08: qty 2, 28d supply, fill #1
  Filled 2022-09-13 – 2022-09-23 (×4): qty 2, 28d supply, fill #2

## 2022-06-22 NOTE — Progress Notes (Signed)
Chief Complaint  Patient presents with   Follow-up    Subjective: Patient is a 47 y.o. female here for f/u weight.  Started on Zepbound, currently on 5 mg/week. Lost around 10 lbs. Reports compliance, some nausea the day after her shot. No other AE's. Staying hydrated. Diet better, lower portions. Walking for exercise.   Past Medical History:  Diagnosis Date   Hyperlipidemia    Hypothyroidism    Rheumatoid arthritis ([facility])     Objective: BP 120/84 (BP Location: Left Arm, Patient Position: Sitting, Cuff Size: Large)   Pulse 79   Temp 98.6 F (37 C) (Oral)   Ht 5\' 2"  (1.575 m)   Wt 193 lb (87.5 kg)   SpO2 98%   BMI 35.30 kg/m  General: Awake, appears stated age Heart: RRR, no LE edema Lungs: CTAB, no rales, wheezes or rhonchi. No accessory muscle use Psych: Age appropriate judgment and insight, normal affect and mood  Assessment and Plan: Obesity (BMI 30-39.9) - Plan: tirzepatide (ZEPBOUND) 5 MG/0.5ML Pen  Chronic, improving. Will stay on the Zepbound 5 mg/week dosage for now. Counseled on diet/exercise. Rec'd wt resistance exercise. F/u in 5 mo for med ck.  The patient voiced understanding and agreement to the plan.  Jilda Roche Dickson, DO 06/22/22  9:10 AM

## 2022-06-22 NOTE — Patient Instructions (Addendum)
Strong work with your weight loss.   Keep the diet clean and stay active.  Consider adding weight resistance exercise to your regimen.   Please reschedule your colonoscopy.   Let us know if you need anything.

## 2022-06-29 ENCOUNTER — Ambulatory Visit (INDEPENDENT_AMBULATORY_CARE_PROVIDER_SITE_OTHER): Payer: BC Managed Care – PPO

## 2022-06-29 ENCOUNTER — Ambulatory Visit (HOSPITAL_BASED_OUTPATIENT_CLINIC_OR_DEPARTMENT_OTHER): Payer: BC Managed Care – PPO | Admitting: Student

## 2022-06-29 DIAGNOSIS — M25551 Pain in right hip: Secondary | ICD-10-CM | POA: Diagnosis not present

## 2022-06-29 NOTE — Progress Notes (Signed)
Chief Complaint: Right hip pain     History of Present Illness:    Kaitlyn Stone is a 47 y.o. female presenting today for evaluation of right hip pain.  She reports that this began this morning, noticing it as soon as she woke up.  She had no known injury to the hip and does not report doing any abnormal activities in the previous few days.  She states that the pain is moderate and is characterized as a constant ache.  The pain is located mostly in the lateral hip and does not radiate.  No groin pain.  She does have a history of a right IM nail in her early teenage years to correct gait pattern.  She is already on meloxicam and also took a Flexeril this morning without noticing much benefit.  She is traveling out of town this weekend to [facility].   Surgical History:   Right hip IM nail- age 24  PMH/PSH/Family History/Social History/Meds/Allergies:    Past Medical History:  Diagnosis Date   Hyperlipidemia    Hypothyroidism    Rheumatoid arthritis ([facility])    Past Surgical History:  Procedure Laterality Date   EYE SURGERY Bilateral    x2   HIP SURGERY Right    WRIST SURGERY Right    Social History   Socioeconomic History   Marital status: Married    Spouse name: Not on file   Number of children: Not on file   Years of education: Not on file   Highest education level: Not on file  Occupational History   Occupation: Print production planner  Tobacco Use   Smoking status: Never   Smokeless tobacco: Never  Vaping Use   Vaping Use: Never used  Substance and Sexual Activity   Alcohol use: Never   Drug use: Never   Sexual activity: Yes    Partners: Male  Other Topics Concern   Not on file  Social History Narrative   Not on file   Social Determinants of Health   Financial Resource Strain: Not on file  Food Insecurity: Not on file  Transportation Needs: Not on file  Physical Activity: Not on file  Stress: Not on file  Social Connections:  Not on file   Family History  Problem Relation Age of Onset   Heart disease Mother    Cancer Mother    Heart disease Father    Diabetes Brother    Healthy Daughter    Polycystic ovary syndrome Daughter    Healthy Daughter    Healthy Daughter    Allergies  Allergen Reactions   Morphine And Codeine Rash   Current Outpatient Medications  Medication Sig Dispense Refill   meloxicam (MOBIC) 15 MG tablet Take 1 tablet (15 mg total) by mouth daily. 90 tablet 0   tirzepatide (ZEPBOUND) 5 MG/0.5ML Pen Inject 5 mg into the skin once a week. 2 mL 5   No current facility-administered medications for this visit.   No results found.  Review of Systems:   A ROS was performed including pertinent positives and negatives as documented in the HPI.  Physical Exam :   Constitutional: NAD and appears stated age Neurological: Alert and oriented Psych: Appropriate affect and cooperative There were no vitals taken for this visit.   Comprehensive Musculoskeletal Exam:    Patient has  mild tenderness to palpation over right greater trochanter.  Passive right hip range of motion to 100 degrees flexion 40 degrees external rotation and 10 degrees internal rotation as compared to 120 degrees flexion 40 degrees external rotation and 20 degrees internal rotation in left hip.  Pain noted in left hip with internal rotation.  Good strength with side-lying hip abduction.  Positive FABER.  Neurosensory exam intact.   Imaging:   Xray (right hip 4 views): Negative.  Normal appearing hardware without evidence of complication.   I personally reviewed and interpreted the radiographs.   Assessment:   47 y.o. female with acute onset of atraumatic right hip pain.  Based on exam, patient may have some degree of trochanteric bursitis however she notes that she has had this for a long time and I do not believe it would be contributing to her hip tightness.  At this point I would be most suspicious of a muscular cause  as opposed to a true pathology of the hip joint however unable to fully rule out based on exam.  I would like to get her in with physical therapy for hip strengthening and stretching.  Encourage continued use of meloxicam and muscle relaxer as needed.  I would like to see her back if symptoms are persistent or worsen in 4 to 6 weeks for further evaluation and can consider MRI at that time if needed.  Plan :    - Referral to physical therapy and return to clinic in 4-6 weeks if no significant improvement     I personally saw and evaluated the patient, and participated in the management and treatment plan.  Hazle Nordmann, PA-C Orthopedics  This document was dictated using Conservation officer, historic buildings. A reasonable attempt at proof reading has been made to minimize errors.

## 2022-07-05 ENCOUNTER — Other Ambulatory Visit (HOSPITAL_BASED_OUTPATIENT_CLINIC_OR_DEPARTMENT_OTHER): Payer: Self-pay

## 2022-07-07 ENCOUNTER — Other Ambulatory Visit (HOSPITAL_BASED_OUTPATIENT_CLINIC_OR_DEPARTMENT_OTHER): Payer: Self-pay

## 2022-07-21 ENCOUNTER — Other Ambulatory Visit (HOSPITAL_BASED_OUTPATIENT_CLINIC_OR_DEPARTMENT_OTHER): Payer: Self-pay

## 2022-08-08 ENCOUNTER — Other Ambulatory Visit (HOSPITAL_BASED_OUTPATIENT_CLINIC_OR_DEPARTMENT_OTHER): Payer: Self-pay

## 2022-09-13 ENCOUNTER — Telehealth: Payer: Self-pay

## 2022-09-13 ENCOUNTER — Other Ambulatory Visit: Payer: Self-pay

## 2022-09-13 NOTE — Telephone Encounter (Signed)
*  Primary  Pharmacy Patient Advocate Encounter   Received notification from CoverMyMeds that prior authorization for Zepbound 5MG /0.5ML pen-injectors  is required/requested.   Insurance verification completed.   The patient is insured through [facility] .   Per test claim: PA required; [place] submitted to [employer] via CoverMyMeds Key/confirmation #/[id number] [facility] Status is pending

## 2022-09-14 ENCOUNTER — Other Ambulatory Visit (HOSPITAL_BASED_OUTPATIENT_CLINIC_OR_DEPARTMENT_OTHER): Payer: Self-pay

## 2022-09-19 ENCOUNTER — Other Ambulatory Visit (HOSPITAL_BASED_OUTPATIENT_CLINIC_OR_DEPARTMENT_OTHER): Payer: Self-pay

## 2022-09-20 ENCOUNTER — Other Ambulatory Visit (HOSPITAL_BASED_OUTPATIENT_CLINIC_OR_DEPARTMENT_OTHER): Payer: Self-pay

## 2022-09-21 ENCOUNTER — Other Ambulatory Visit (HOSPITAL_BASED_OUTPATIENT_CLINIC_OR_DEPARTMENT_OTHER): Payer: Self-pay

## 2022-09-22 ENCOUNTER — Other Ambulatory Visit (HOSPITAL_BASED_OUTPATIENT_CLINIC_OR_DEPARTMENT_OTHER): Payer: Self-pay

## 2022-09-22 ENCOUNTER — Other Ambulatory Visit: Payer: Self-pay

## 2022-09-22 NOTE — Telephone Encounter (Signed)
Received fax for additional info. Last weigh in was 06/22/22. Need updated weigh in to ensure PA will be approved. Please advise.

## 2022-09-23 ENCOUNTER — Encounter: Payer: Self-pay | Admitting: Family Medicine

## 2022-09-23 ENCOUNTER — Other Ambulatory Visit: Payer: Self-pay

## 2022-09-26 ENCOUNTER — Encounter: Payer: Self-pay | Admitting: Family Medicine

## 2022-09-26 ENCOUNTER — Ambulatory Visit: Payer: BC Managed Care – PPO | Admitting: Family Medicine

## 2022-09-26 ENCOUNTER — Other Ambulatory Visit (HOSPITAL_BASED_OUTPATIENT_CLINIC_OR_DEPARTMENT_OTHER): Payer: Self-pay

## 2022-09-26 DIAGNOSIS — Z6832 Body mass index (BMI) 32.0-32.9, adult: Secondary | ICD-10-CM | POA: Diagnosis not present

## 2022-09-26 DIAGNOSIS — E669 Obesity, unspecified: Secondary | ICD-10-CM | POA: Diagnosis not present

## 2022-09-26 MED ORDER — ZEPBOUND 5 MG/0.5ML ~~LOC~~ SOAJ
5.0000 mg | SUBCUTANEOUS | 5 refills | Status: DC
  Filled 2022-09-26 – 2022-10-05 (×3): qty 2, 28d supply, fill #0
  Filled 2022-11-08: qty 2, 28d supply, fill #1
  Filled 2022-12-05: qty 2, 28d supply, fill #2
  Filled 2023-01-03: qty 2, 28d supply, fill #3
  Filled 2023-02-07: qty 2, 28d supply, fill #4
  Filled 2023-03-07: qty 2, 28d supply, fill #5

## 2022-09-26 NOTE — Assessment & Plan Note (Signed)
Significant weight loss since starting Zepbound (from 202 lbs to 178 lbs). Reports intermittent nausea with medication but overall tolerating well. -Continue Zepbound 5mg  weekly. -Refill Zepbound prescription  -Plan for weight check in 3 months.

## 2022-09-26 NOTE — Progress Notes (Signed)
Established Patient Office Visit  Subjective   Patient ID: Kaitlyn Stone, female    DOB: 1975-08-29  Age: 47 y.o. MRN: [id number]  Chief Complaint  Patient presents with   Medical Management of Chronic Issues    HPI   Discussed the use of AI scribe software for clinical note transcription with the patient, who gave verbal consent to proceed.  History of Present Illness   The patient, on a regimen of Zepbound 5mg  weekly since May, initially started on 2.5mg  in the spring. She reports intermittent nausea with every other dose, which is manageable and not severe. The patient has noticed significant weight loss since starting the medication, from 202 lbs in March to 178 lbs currently. This weight loss is significant for the patient, who previously struggled with slow weight loss on Weight [employer].  In addition to weight loss, she reports a reduction in snoring, no longer waking herself or her spouse up. She also notes improved overall well-being and fitting better into her clothes.  The patient has been incorporating exercise into her routine, focusing on walking and light upper body workouts due to joint issues. She has been mindful of her diet, aiming for 100 grams of protein daily and maintaining a caloric intake of around 1500-1800 calories. She has been managing her portion sizes and feels that this has contributed to her continued weight loss. She would like to continue Zepbound at current dose, given she is still making good progress.         Wt Readings from Last 3 Encounters:  09/26/22 178 lb (80.7 kg)  06/22/22 193 lb (87.5 kg)  05/03/22 202 lb (91.6 kg)         ROS All review of systems negative except what is listed in the HPI    Objective:     BP 125/77   Pulse 65   Ht 5\' 2"  (1.575 m)   Wt 178 lb (80.7 kg)   SpO2 99%   BMI 32.56 kg/m    Physical Exam Vitals reviewed.  Constitutional:      Appearance: Normal appearance. She is obese.   Cardiovascular:     Rate and Rhythm: Normal rate and regular rhythm.     Heart sounds: Normal heart sounds.  Pulmonary:     Effort: Pulmonary effort is normal.     Breath sounds: Normal breath sounds.  Skin:    General: Skin is warm and dry.  Neurological:     Mental Status: She is alert and oriented to person, place, and time.  Psychiatric:        Mood and Affect: Mood normal.        Behavior: Behavior normal.        Thought Content: Thought content normal.        Judgment: Judgment normal.      No results found for any visits on 09/26/22.    The 10-year ASCVD risk score (Arnett DK, et al., 2019) is: 1.5%    Assessment & Plan:   Problem List Items Addressed This Visit     Obesity (BMI 30-39.9)    Significant weight loss since starting Zepbound (from 202 lbs to 178 lbs). Reports intermittent nausea with medication but overall tolerating well. -Continue Zepbound 5mg  weekly. -Refill Zepbound prescription  -Plan for weight check in 3 months.        Relevant Medications   tirzepatide (ZEPBOUND) 5 MG/0.5ML Pen    Return in about 3 months (around 12/27/2022) for weight management PCP.  Clayborne Dana, NP

## 2022-09-27 ENCOUNTER — Other Ambulatory Visit (HOSPITAL_BASED_OUTPATIENT_CLINIC_OR_DEPARTMENT_OTHER): Payer: Self-pay

## 2022-09-28 ENCOUNTER — Other Ambulatory Visit (HOSPITAL_BASED_OUTPATIENT_CLINIC_OR_DEPARTMENT_OTHER): Payer: Self-pay

## 2022-09-28 NOTE — Telephone Encounter (Signed)
Pharmacy Patient Advocate Encounter  Received notification from [facility] that Prior Authorization for Zepbound 5mg /0.28ml has been DENIED. Please advise how you'd like to proceed. Full denial letter will be uploaded to the media tab. See denial reason below.   PA #/Case ID/Reference #: [id number]

## 2022-09-28 NOTE — Telephone Encounter (Signed)
She has lost more than 5% of starting weight. Can you make sure they were running the PA off of most recent weight? Thank you   Wt Readings from Last 3 Encounters:  09/26/22 178 lb (80.7 kg)  06/22/22 193 lb (87.5 kg)  05/03/22 202 lb (91.6 kg)

## 2022-09-29 ENCOUNTER — Telehealth: Payer: Self-pay

## 2022-09-29 ENCOUNTER — Other Ambulatory Visit (HOSPITAL_BASED_OUTPATIENT_CLINIC_OR_DEPARTMENT_OTHER): Payer: Self-pay

## 2022-09-29 NOTE — Telephone Encounter (Signed)
Submitted an Eappeal via CoverMyMeds to [employer] [place]   Key: [facility]  PA Case ID #: [id number]

## 2022-10-03 NOTE — Telephone Encounter (Signed)
Received a fax for additional information.   Faxed to [phone]

## 2022-10-04 DIAGNOSIS — R002 Palpitations: Secondary | ICD-10-CM | POA: Diagnosis not present

## 2022-10-05 ENCOUNTER — Other Ambulatory Visit (HOSPITAL_BASED_OUTPATIENT_CLINIC_OR_DEPARTMENT_OTHER): Payer: Self-pay

## 2022-10-21 ENCOUNTER — Other Ambulatory Visit (HOSPITAL_COMMUNITY): Payer: Self-pay

## 2022-10-21 NOTE — Telephone Encounter (Signed)
Pharmacy Patient Advocate Encounter  Received notification from [employer] that Prior Authorization for Zepbound 5mg /0.49ml has been APPROVED from 09/15/22 to 09/15/23

## 2022-11-08 ENCOUNTER — Other Ambulatory Visit (HOSPITAL_BASED_OUTPATIENT_CLINIC_OR_DEPARTMENT_OTHER): Payer: Self-pay

## 2022-12-27 ENCOUNTER — Ambulatory Visit: Payer: BC Managed Care – PPO | Admitting: Family Medicine

## 2023-01-03 ENCOUNTER — Other Ambulatory Visit (HOSPITAL_BASED_OUTPATIENT_CLINIC_OR_DEPARTMENT_OTHER): Payer: Self-pay

## 2023-01-04 ENCOUNTER — Other Ambulatory Visit: Payer: Self-pay

## 2023-01-04 ENCOUNTER — Ambulatory Visit: Payer: BC Managed Care – PPO | Admitting: Family Medicine

## 2023-01-04 ENCOUNTER — Other Ambulatory Visit (HOSPITAL_BASED_OUTPATIENT_CLINIC_OR_DEPARTMENT_OTHER): Payer: Self-pay

## 2023-01-04 ENCOUNTER — Encounter: Payer: Self-pay | Admitting: Family Medicine

## 2023-01-04 ENCOUNTER — Other Ambulatory Visit: Payer: Self-pay | Admitting: Family Medicine

## 2023-01-04 VITALS — BP 122/82 | HR 91 | Temp 98.0°F | Resp 16 | Ht 62.0 in | Wt 163.0 lb

## 2023-01-04 DIAGNOSIS — E669 Obesity, unspecified: Secondary | ICD-10-CM

## 2023-01-04 DIAGNOSIS — E782 Mixed hyperlipidemia: Secondary | ICD-10-CM

## 2023-01-04 DIAGNOSIS — I491 Atrial premature depolarization: Secondary | ICD-10-CM

## 2023-01-04 DIAGNOSIS — F418 Other specified anxiety disorders: Secondary | ICD-10-CM

## 2023-01-04 DIAGNOSIS — M545 Low back pain, unspecified: Secondary | ICD-10-CM

## 2023-01-04 LAB — LIPID PANEL
Cholesterol: 271 mg/dL — ABNORMAL HIGH (ref 0–200)
HDL: 42.8 mg/dL (ref >39.00)
LDL Cholesterol: 210 mg/dL — ABNORMAL HIGH (ref 0–99)
NonHDL: 227.71
Total CHOL/HDL Ratio: 6
Triglycerides: 88 mg/dL (ref 0.0–149.0)
VLDL: 17.6 mg/dL (ref 0.0–40.0)

## 2023-01-04 LAB — COMPREHENSIVE METABOLIC PANEL
ALT: 13 U/L (ref 0–35)
AST: 14 U/L (ref 0–37)
Albumin: 4.5 g/dL (ref 3.5–5.2)
Alkaline Phosphatase: 66 U/L (ref 39–117)
BUN: 13 mg/dL (ref 6–23)
CO2: 27 meq/L (ref 19–32)
Calcium: 9.2 mg/dL (ref 8.4–10.5)
Chloride: 103 meq/L (ref 96–112)
Creatinine, Ser: 1.01 mg/dL (ref 0.40–1.20)
GFR: 65.91 mL/min (ref >60.00)
Glucose, Bld: 87 mg/dL (ref 70–99)
Potassium: 4.1 meq/L (ref 3.5–5.1)
Sodium: 139 meq/L (ref 135–145)
Total Bilirubin: 0.7 mg/dL (ref 0.2–1.2)
Total Protein: 6.9 g/dL (ref 6.0–8.3)

## 2023-01-04 MED ORDER — TIZANIDINE HCL 4 MG PO TABS
4.0000 mg | ORAL_TABLET | Freq: Four times a day (QID) | ORAL | 0 refills | Status: DC | PRN
  Filled 2023-01-04: qty 21, 6d supply, fill #0

## 2023-01-04 MED ORDER — KETOROLAC TROMETHAMINE 30 MG/ML IJ SOLN
30.0000 mg | Freq: Once | INTRAMUSCULAR | Status: AC
  Administered 2023-01-04: 30 mg via INTRAMUSCULAR

## 2023-01-04 MED ORDER — ROSUVASTATIN CALCIUM 10 MG PO TABS
10.0000 mg | ORAL_TABLET | Freq: Every day | ORAL | 3 refills | Status: DC
  Filled 2023-01-04: qty 30, 30d supply, fill #0
  Filled 2023-02-07: qty 30, 30d supply, fill #1
  Filled 2023-03-07: qty 30, 30d supply, fill #2
  Filled 2023-04-06: qty 30, 30d supply, fill #3

## 2023-01-04 MED ORDER — METHYLPREDNISOLONE ACETATE 40 MG/ML IJ SUSP
40.0000 mg | Freq: Once | INTRAMUSCULAR | Status: AC
  Administered 2023-01-04: 40 mg via INTRAMUSCULAR

## 2023-01-04 MED ORDER — PROPRANOLOL HCL 10 MG PO TABS
10.0000 mg | ORAL_TABLET | Freq: Three times a day (TID) | ORAL | 1 refills | Status: DC | PRN
  Filled 2023-01-04 (×2): qty 30, 10d supply, fill #0

## 2023-01-04 NOTE — Patient Instructions (Addendum)

## 2023-01-04 NOTE — Progress Notes (Signed)
Chief Complaint  Patient presents with   Follow-up    Follow up    Subjective: Patient is a 47 y.o. female here for f/u.  Patient has a history of obesity on Zepbound 5 mg weekly.  She reports compliance and no adverse effects.  She has lost around 35 pounds.  Diet is healthy.  Cravings are decreased as well as her appetite. She has been lifting wts and walking for exercise.   Low back pain 2 d ago, hurt her low back. No inj or change in activity. Located in low R back. Aching/sharp in nature. Radiates into her buttock. No neuro s/s's, loss of bowel/bladder control, bruising, redness, swelling. Took Toradol at home without relief.   PACs- hx of this. Comes and goes. Has never been a medication. Gives her anxiety. No CP, SOB, lightheadedness. Sees cardiology.   Past Medical History:  Diagnosis Date   Hyperlipidemia    Hypothyroidism    Rheumatoid arthritis (HCC)     Objective: BP 122/82 (BP Location: Left Arm, Patient Position: Sitting, Cuff Size: Normal)   Pulse 91   Temp 98 F (36.7 C) (Oral)   Resp 16   Ht 5\' 2"  (1.575 m)   Wt 163 lb (73.9 kg)   SpO2 96%   BMI 29.81 kg/m  General: Awake, appears stated age Heart: RRR, no LE edema MSK: Mild TTP over the right erector spinae muscle group and proximal glute, poor hamstring range of motion bilaterally Neuro: DTRs equal and symmetric throughout the lower extremities, no clonus, no cerebellar signs, 5/5 hip flexion bilaterally Lungs: CTAB, no rales, wheezes or rhonchi. No accessory muscle use Psych: Age appropriate judgment and insight, normal affect and mood  Assessment and Plan: Obesity (BMI 30-39.9)  Acute right-sided low back pain without sciatica - Plan: tiZANidine (ZANAFLEX) 4 MG tablet  Situational anxiety - Plan: propranolol (INDERAL) 10 MG tablet  PAC (premature atrial contraction) - Plan: propranolol (INDERAL) 10 MG tablet  Mixed hyperlipidemia - Plan: Comprehensive metabolic panel, Lipid panel  Chronic,  stable.  Continue Zepbound 5 mg weekly.  Counseled on diet and exercise. Heat, ice, Tylenol, stretches and exercises, nonsedating muscle relaxer as above.  Combination of Depo-Medrol and Toradol given today as this has historically worked well for her. Chronic, unstable.  Propranolol 10 mg 3 times daily as needed.  Secondary to #4. Chronic, unstable.  Hopefully beta-blocker can help with her symptoms as well. Strong family history of elevated cholesterol and heart disease.  If still elevated despite her 40 pound weight loss, will start a statin. Follow-up in 6 months or as needed. The patient voiced understanding and agreement to the plan.  Jilda Roche [place], DO 01/04/23  9:33 AM

## 2023-01-09 ENCOUNTER — Other Ambulatory Visit (HOSPITAL_COMMUNITY): Payer: Self-pay

## 2023-02-13 ENCOUNTER — Other Ambulatory Visit: Payer: BC Managed Care – PPO

## 2023-02-24 ENCOUNTER — Emergency Department (HOSPITAL_BASED_OUTPATIENT_CLINIC_OR_DEPARTMENT_OTHER): Payer: BC Managed Care – PPO

## 2023-02-24 ENCOUNTER — Encounter (HOSPITAL_BASED_OUTPATIENT_CLINIC_OR_DEPARTMENT_OTHER): Payer: Self-pay | Admitting: Emergency Medicine

## 2023-02-24 ENCOUNTER — Other Ambulatory Visit: Payer: Self-pay

## 2023-02-24 ENCOUNTER — Emergency Department (HOSPITAL_BASED_OUTPATIENT_CLINIC_OR_DEPARTMENT_OTHER)
Admission: EM | Admit: 2023-02-24 | Discharge: 2023-02-24 | Disposition: A | Discharge status: Home / Self Care | Payer: BC Managed Care – PPO | Attending: Emergency Medicine | Admitting: Emergency Medicine

## 2023-02-24 DIAGNOSIS — N132 Hydronephrosis with renal and ureteral calculous obstruction: Secondary | ICD-10-CM | POA: Diagnosis not present

## 2023-02-24 DIAGNOSIS — E039 Hypothyroidism, unspecified: Secondary | ICD-10-CM | POA: Diagnosis not present

## 2023-02-24 DIAGNOSIS — R109 Unspecified abdominal pain: Secondary | ICD-10-CM | POA: Diagnosis not present

## 2023-02-24 DIAGNOSIS — N200 Calculus of kidney: Secondary | ICD-10-CM | POA: Diagnosis not present

## 2023-02-24 DIAGNOSIS — D72829 Elevated white blood cell count, unspecified: Secondary | ICD-10-CM | POA: Insufficient documentation

## 2023-02-24 LAB — CBC WITH DIFFERENTIAL/PLATELET
Abs Immature Granulocytes: 0.03 10*3/uL (ref 0.00–0.07)
Basophils Absolute: 0 10*3/uL (ref 0.0–0.1)
Basophils Relative: 0 %
Eosinophils Absolute: 0.1 10*3/uL (ref 0.0–0.5)
Eosinophils Relative: 1 %
HCT: 44.6 % (ref 36.0–46.0)
Hemoglobin: 15.1 g/dL — ABNORMAL HIGH (ref 12.0–15.0)
Immature Granulocytes: 0 %
Lymphocytes Relative: 27 %
Lymphs Abs: 2.9 10*3/uL (ref 0.7–4.0)
MCH: 28.1 pg (ref 26.0–34.0)
MCHC: 33.9 g/dL (ref 30.0–36.0)
MCV: 82.9 fL (ref 80.0–100.0)
Monocytes Absolute: 0.6 10*3/uL (ref 0.1–1.0)
Monocytes Relative: 6 %
Neutro Abs: 7.1 10*3/uL (ref 1.7–7.7)
Neutrophils Relative %: 66 %
Platelets: 327 10*3/uL (ref 150–400)
RBC: 5.38 MIL/uL — ABNORMAL HIGH (ref 3.87–5.11)
RDW: 13.7 % (ref 11.5–15.5)
WBC: 10.8 10*3/uL — ABNORMAL HIGH (ref 4.0–10.5)
nRBC: 0 % (ref 0.0–0.2)

## 2023-02-24 LAB — URINALYSIS, W/ REFLEX TO CULTURE (INFECTION SUSPECTED)
Bilirubin Urine: NEGATIVE
Glucose, UA: NEGATIVE mg/dL
Ketones, ur: 40 mg/dL — AB
Nitrite: NEGATIVE
Protein, ur: 30 mg/dL — AB
Specific Gravity, Urine: 1.025 (ref 1.005–1.030)
pH: 7 (ref 5.0–8.0)

## 2023-02-24 LAB — PREGNANCY, URINE: Preg Test, Ur: NEGATIVE

## 2023-02-24 LAB — COMPREHENSIVE METABOLIC PANEL
ALT: 18 U/L (ref 0–44)
AST: 17 U/L (ref 15–41)
Albumin: 4.3 g/dL (ref 3.5–5.0)
Alkaline Phosphatase: 60 U/L (ref 38–126)
Anion gap: 12 (ref 5–15)
BUN: 18 mg/dL (ref 6–20)
CO2: 24 mmol/L (ref 22–32)
Calcium: 9.2 mg/dL (ref 8.9–10.3)
Chloride: 103 mmol/L (ref 98–111)
Creatinine, Ser: 1.08 mg/dL — ABNORMAL HIGH (ref 0.44–1.00)
GFR, Estimated: >60 mL/min (ref >60)
Glucose, Bld: 110 mg/dL — ABNORMAL HIGH (ref 70–99)
Potassium: 3.6 mmol/L (ref 3.5–5.1)
Sodium: 139 mmol/L (ref 135–145)
Total Bilirubin: 1 mg/dL (ref 0.0–1.2)
Total Protein: 7.4 g/dL (ref 6.5–8.1)

## 2023-02-24 LAB — LIPASE, BLOOD: Lipase: 40 U/L (ref 11–51)

## 2023-02-24 MED ORDER — FENTANYL CITRATE PF 50 MCG/ML IJ SOSY
50.0000 ug | PREFILLED_SYRINGE | INTRAMUSCULAR | Status: DC | PRN
  Administered 2023-02-24: 50 ug via INTRAVENOUS
  Filled 2023-02-24: qty 1

## 2023-02-24 MED ORDER — ONDANSETRON 4 MG PO TBDP: 4.0000 mg | ORAL_TABLET | Freq: Three times a day (TID) | ORAL | 0 refills | Status: DC | PRN

## 2023-02-24 MED ORDER — OXYCODONE-ACETAMINOPHEN 5-325 MG PO TABS: 1.0000 | ORAL_TABLET | Freq: Four times a day (QID) | ORAL | 0 refills | Status: DC | PRN

## 2023-02-24 MED ORDER — SENNOSIDES-DOCUSATE SODIUM 8.6-50 MG PO TABS: 1.0000 | ORAL_TABLET | Freq: Every evening | ORAL | 0 refills | Status: DC | PRN

## 2023-02-24 MED ORDER — KETOROLAC TROMETHAMINE 30 MG/ML IJ SOLN
30.0000 mg | Freq: Once | INTRAMUSCULAR | Status: AC
  Administered 2023-02-24: 30 mg via INTRAVENOUS
  Filled 2023-02-24: qty 1

## 2023-02-24 MED ORDER — ONDANSETRON HCL 4 MG/2ML IJ SOLN
4.0000 mg | Freq: Once | INTRAMUSCULAR | Status: AC
  Administered 2023-02-24: 4 mg via INTRAVENOUS
  Filled 2023-02-24: qty 2

## 2023-02-24 MED ORDER — TAMSULOSIN HCL 0.4 MG PO CAPS: 0.4000 mg | ORAL_CAPSULE | Freq: Every day | ORAL | 0 refills | Status: DC

## 2023-02-24 MED ORDER — IBUPROFEN 800 MG PO TABS: 800.0000 mg | ORAL_TABLET | Freq: Three times a day (TID) | ORAL | 0 refills | Status: DC | PRN

## 2023-02-24 NOTE — ED Notes (Signed)
Pt provided with specimen cup in order to obtain urine sample.

## 2023-02-24 NOTE — Discharge Instructions (Signed)
You have been seen in the Emergency Department (ED) today for pain that we believe based on your workup, is caused by kidney stones.  As we have discussed, please drink plenty of fluids.  Please make a follow up appointment with the physician(s) listed elsewhere in this documentation.  You may take pain medication as needed but ONLY as prescribed.  Please also take your prescribed Flomax daily.  We also recommend that you take over-the-counter ibuprofen regularly according to label instructions over the next 5 days.  Take it with meals to minimize stomach discomfort.  Please see your doctor as soon as possible as stones may take 1-3 weeks to pass and you may require additional care or medications.  Do not drink alcohol, drive or participate in any other potentially dangerous activities while taking opiate pain medication as it may make you sleepy. Do not take this medication with any other sedating medications, either prescription or over-the-counter. If you were prescribed Percocet or Vicodin, do not take these with acetaminophen (Tylenol) as it is already contained within these medications.   Take Percocet as needed for severe pain.  This medication is an opiate (or narcotic) pain medication and can be habit forming.  Use it as little as possible to achieve adequate pain control.  Do not use or use it with extreme caution if you have a history of opiate abuse or dependence.  If you are on a pain contract with your primary care doctor or a pain specialist, be sure to let them know you were prescribed this medication today from the Emergency Department.  This medication is intended for your use only - do not give any to anyone else and keep it in a secure place where nobody else, especially children, have access to it.  It will also cause or worsen constipation, so you may want to consider taking an over-the-counter stool softener while you are taking this medication.  Return to the Emergency Department  (ED) or call your doctor if you have any worsening pain, fever, painful urination, are unable to urinate, or develop other symptoms that concern you.    

## 2023-02-24 NOTE — ED Triage Notes (Signed)
Left side lower back/ side pain X 1 hour. States has Hx of Kidney stones.

## 2023-02-24 NOTE — ED Provider Notes (Signed)
Emergency Department Provider Note   I have reviewed the triage vital signs and the nursing notes.   HISTORY  Chief Complaint Flank Pain   HPI Kaitlyn Stone is a 48 y.o. female past history reviewed below presents emergency department valuation of left flank pain.  Symptoms began approximately 1 hour prior to arrival.  Patient describes severe pain starting in the left mid-back and radiating to the LLQ of the abdomen. No UTI symptoms. No fever. Has history of kidney stone which required intervention to pass but cannot recall the pain specifically.    Past Medical History:  Diagnosis Date   Hyperlipidemia    Hypothyroidism    Rheumatoid arthritis (HCC)     Review of Systems  Constitutional: No fever/chills Cardiovascular: Denies chest pain. Respiratory: Denies shortness of breath. Gastrointestinal: Left flank/abdominal pain. Positive nausea, no vomiting.  No diarrhea.  No constipation. Genitourinary: Negative for dysuria. Musculoskeletal: Negative for back pain. Skin: Negative for rash. Neurological: Negative for headaches.  ____________________________________________   PHYSICAL EXAM:  VITAL SIGNS: ED Triage Vitals  Encounter Vitals Group     BP 02/24/23 0310 (!) 143/95     Pulse Rate 02/24/23 0310 98     Resp 02/24/23 0310 (!) 24     Temp 02/24/23 0310 (!) 97.5 F (36.4 C)     Temp Source 02/24/23 0310 Oral     SpO2 02/24/23 0310 99 %     Weight 02/24/23 0311 152 lb (68.9 kg)     Height 02/24/23 0311 5\' 2"  (1.575 m)   Constitutional: Alert and oriented. Able to provide a history but appears uncomfortable.  Eyes: Conjunctivae are normal.  Head: Atraumatic. Nose: No congestion/rhinnorhea. Mouth/Throat: Mucous membranes are moist.  Neck: No stridor.   Cardiovascular: Normal rate, regular rhythm. Good peripheral circulation. Grossly normal heart sounds.   Respiratory: Normal respiratory effort.  No retractions. Lungs CTAB. Gastrointestinal: Soft and  nontender. No distention.  Musculoskeletal: No gross deformities of extremities. Neurologic:  Normal speech and language. Skin:  Skin is warm, dry and intact. No rash noted.  ____________________________________________   LABS (all labs ordered are listed, but only abnormal results are displayed)  Labs Reviewed  URINALYSIS, W/ REFLEX TO CULTURE (INFECTION SUSPECTED) - Abnormal; Notable for the following components:      Result Value   Hgb urine dipstick LARGE (*)    Ketones, ur 40 (*)    Protein, ur 30 (*)    Leukocytes,Ua TRACE (*)    Bacteria, UA FEW (*)    All other components within normal limits  COMPREHENSIVE METABOLIC PANEL - Abnormal; Notable for the following components:   Glucose, Bld 110 (*)    Creatinine, Ser 1.08 (*)    All other components within normal limits  CBC WITH DIFFERENTIAL/PLATELET - Abnormal; Notable for the following components:   WBC 10.8 (*)    RBC 5.38 (*)    Hemoglobin 15.1 (*)    All other components within normal limits  LIPASE, BLOOD  PREGNANCY, URINE   ____________________________________________  RADIOLOGY  CT Renal Stone Study Result Date: 02/24/2023 CLINICAL DATA:  Left-sided low back pain and flank pain EXAM: CT ABDOMEN AND PELVIS WITHOUT CONTRAST TECHNIQUE: Multidetector CT imaging of the abdomen and pelvis was performed following the standard protocol without IV contrast. RADIATION DOSE REDUCTION: This exam was performed according to the departmental dose-optimization program which includes automated exposure control, adjustment of the mA and/or kV according to patient size and/or use of iterative reconstruction technique. COMPARISON:  01/22/2010 abdominal  CT. FINDINGS: Lower chest:  No contributory findings. Hepatobiliary: No focal liver abnormality.No evidence of biliary obstruction or stone. Pancreas: Unremarkable. Spleen: Unremarkable. Adrenals/Urinary Tract: Negative adrenals. 7 x 4 mm stone in the mid left ureter (L4-5 level) with mild  left hydronephrosis and perinephric stranding. No additional calculus. Unremarkable bladder. Stomach/Bowel:  No obstruction. No appendicitis. Vascular/Lymphatic: No acute vascular abnormality. No mass or adenopathy. Reproductive:No pathologic findings. Other: No ascites or pneumoperitoneum. Musculoskeletal: No acute abnormalities. Right femoral nail fixation. Notable L5-S1 disc degeneration. IMPRESSION: Mild left hydronephrosis from a 7 x 4 mm mid ureteral calculus. Electronically Signed   By: Tiburcio Pea M.D.   On: 02/24/2023 06:27    ____________________________________________   PROCEDURES  Procedure(s) performed:   Procedures  None  ____________________________________________   INITIAL IMPRESSION / ASSESSMENT AND PLAN / ED COURSE  Pertinent labs & imaging results that were available during my care of the patient were reviewed by me and considered in my medical decision making (see chart for details).   This patient is Presenting for Evaluation of abdominal pain, which does require a range of treatment options, and is a complaint that involves a high risk of morbidity and mortality.  The Differential Diagnoses includes but is not exclusive to ectopic pregnancy, ovarian cyst, ovarian torsion, acute appendicitis, urinary tract infection, endometriosis, bowel obstruction, hernia, colitis, renal colic, gastroenteritis, volvulus etc.  Critical Interventions-    Medications  fentaNYL (SUBLIMAZE) injection 50 mcg (50 mcg Intravenous Given 02/24/23 0456)  ketorolac (TORADOL) 30 MG/ML injection 30 mg (30 mg Intravenous Given 02/24/23 0323)  ondansetron (ZOFRAN) injection 4 mg (4 mg Intravenous Given 02/24/23 0322)    Reassessment after intervention: pain improved.   Clinical Laboratory Tests Ordered, included UA without infection. CBC with mild leukocytosis. No AKI. Pregnancy negative.   Radiologic Tests Ordered, included CT renal. I independently interpreted the images and agree with  radiology interpretation.   Cardiac Monitor Tracing which shows NSR.    Social Determinants of Health Risk patient is a non-smoker.   Medical Decision Making: Summary:  Patient presents emergency room with left flank pain and nausea which began suddenly this evening.  Has history of kidney stones and feels somewhat similar.  Vascular etiology also considered but given history and exam we will move forward with contrast CT imaging and reassess.  Reevaluation with update and discussion with patient. Ureteral stone noted. Pain well controlled on reassessment. Plan for d/c with Urology follow up.   Patient's presentation is most consistent with acute presentation with potential threat to life or bodily function.   Disposition: discharge  ____________________________________________  FINAL CLINICAL IMPRESSION(S) / ED DIAGNOSES  Final diagnoses:  Kidney stone     NEW OUTPATIENT MEDICATIONS STARTED DURING THIS VISIT:  Discharge Medication List as of 02/24/2023  6:50 AM     START taking these medications   Details  ibuprofen (ADVIL) 800 MG tablet Take 1 tablet (800 mg total) by mouth every 8 (eight) hours as needed., Starting Fri 02/24/2023, Normal    ondansetron (ZOFRAN-ODT) 4 MG disintegrating tablet Take 1 tablet (4 mg total) by mouth every 8 (eight) hours as needed., Starting Fri 02/24/2023, Normal    oxyCODONE-acetaminophen (PERCOCET/ROXICET) 5-325 MG tablet Take 1 tablet by mouth every 6 (six) hours as needed for severe pain (pain score 7-10)., Starting Fri 02/24/2023, Normal    senna-docusate (SENOKOT-S) 8.6-50 MG tablet Take 1 tablet by mouth at bedtime as needed for mild constipation., Starting Fri 02/24/2023, Normal    tamsulosin (FLOMAX) 0.4 MG  CAPS capsule Take 1 capsule (0.4 mg total) by mouth daily for 14 days., Starting Fri 02/24/2023, Until Fri 03/10/2023, Normal        Note:  This document was prepared using Dragon voice recognition software and may include  unintentional dictation errors.  Alona Bene, MD, [facility]    Joniel Graumann, Arlyss Repress, MD 02/24/23 [phone]

## 2023-02-27 ENCOUNTER — Ambulatory Visit: Payer: BC Managed Care – PPO | Admitting: Family Medicine

## 2023-02-27 ENCOUNTER — Encounter: Payer: Self-pay | Admitting: Family Medicine

## 2023-02-27 VITALS — BP 130/84 | HR 84 | Temp 98.0°F | Resp 16 | Ht 62.0 in | Wt 155.4 lb

## 2023-02-27 DIAGNOSIS — D582 Other hemoglobinopathies: Secondary | ICD-10-CM | POA: Diagnosis not present

## 2023-02-27 DIAGNOSIS — E782 Mixed hyperlipidemia: Secondary | ICD-10-CM | POA: Diagnosis not present

## 2023-02-27 DIAGNOSIS — N200 Calculus of kidney: Secondary | ICD-10-CM | POA: Diagnosis not present

## 2023-02-27 NOTE — Patient Instructions (Signed)
Give us 2-3 business days to get the results of your labs back.  Let us know if you need anything.  

## 2023-02-27 NOTE — Progress Notes (Signed)
Chief Complaint  Patient presents with   Hospitalization Follow-up    Follow up    Subjective: Patient is a 48 y.o. female here for f/u ER.  3 days ago, the patient was diagnosed with a kidney stone.  Measures 7 x 4 mm in the left ureter.  She has an appointment with urology in 2 days.  She has not needed much pain medicine other than ibuprofen.  Pain is mainly in the left lower quadrant region of her abdomen rather than in the flank.  She has had 1 kidney stone in the past that required surgical removal.  She is taking the Flomax daily as prescribed.  She is taking Zofran for nausea.  Movement seems to exacerbate this.  She is staying hydrated.  No blood in her urine.  Past Medical History:  Diagnosis Date   Hyperlipidemia    Hypothyroidism    Rheumatoid arthritis (HCC)     Objective: BP 130/84   Pulse 84   Temp 98 F (36.7 C) (Oral)   Resp 16   Ht 5\' 2"  (1.575 m)   Wt 155 lb 6.4 oz (70.5 kg)   SpO2 96%   BMI 28.42 kg/m  General: Awake, appears stated age Heart: RRR, no LE edema Lungs: CTAB, no rales, wheezes or rhonchi. No accessory muscle use MSK: No CVA TTP, negative Lloyd sign Abdomen: Bowel sounds present, soft, nondistended, mild TTP in the left lower quadrant region Psych: Age appropriate judgment and insight, normal affect and mood  Assessment and Plan: Kidney stone on left side  Mixed hyperlipidemia - Plan: Lipid panel, Hepatic function panel  Elevated hemoglobin (HCC) - Plan: CBC, Erythropoietin  Continue medications from the ER.  Letter for work written allowing her to work from home given the frequent nausea she is experiencing.  She will return next Monday to regular duty.  Appreciate urology input. Will follow-up on the other labs as above. The patient voiced understanding and agreement to the plan.  Jilda Roche [place], DO 02/27/23  4:30 PM

## 2023-02-28 ENCOUNTER — Other Ambulatory Visit: Payer: Self-pay

## 2023-02-28 ENCOUNTER — Encounter: Payer: Self-pay | Admitting: Family Medicine

## 2023-02-28 DIAGNOSIS — D582 Other hemoglobinopathies: Secondary | ICD-10-CM

## 2023-02-28 LAB — CBC
HCT: 45.4 % (ref 36.0–46.0)
Hemoglobin: 15.3 g/dL — ABNORMAL HIGH (ref 12.0–15.0)
MCHC: 33.7 g/dL (ref 30.0–36.0)
MCV: 85.3 fL (ref 78.0–100.0)
Platelets: 278 10*3/uL (ref 150.0–400.0)
RBC: 5.32 Mil/uL — ABNORMAL HIGH (ref 3.87–5.11)
RDW: 14.3 % (ref 11.5–15.5)
WBC: 7.4 10*3/uL (ref 4.0–10.5)

## 2023-02-28 LAB — LIPID PANEL
Cholesterol: 171 mg/dL (ref 0–200)
HDL: 48.6 mg/dL (ref >39.00)
LDL Cholesterol: 102 mg/dL — ABNORMAL HIGH (ref 0–99)
NonHDL: 122.44
Total CHOL/HDL Ratio: 4
Triglycerides: 100 mg/dL (ref 0.0–149.0)
VLDL: 20 mg/dL (ref 0.0–40.0)

## 2023-02-28 LAB — HEPATIC FUNCTION PANEL
ALT: 12 U/L (ref 0–35)
AST: 11 U/L (ref 0–37)
Albumin: 4.5 g/dL (ref 3.5–5.2)
Alkaline Phosphatase: 63 U/L (ref 39–117)
Bilirubin, Direct: 0.1 mg/dL (ref 0.0–0.3)
Total Bilirubin: 0.4 mg/dL (ref 0.2–1.2)
Total Protein: 7.1 g/dL (ref 6.0–8.3)

## 2023-03-01 ENCOUNTER — Encounter: Payer: Self-pay | Admitting: Urology

## 2023-03-01 ENCOUNTER — Ambulatory Visit (HOSPITAL_BASED_OUTPATIENT_CLINIC_OR_DEPARTMENT_OTHER)
Admission: RE | Admit: 2023-03-01 | Discharge: 2023-03-01 | Disposition: A | Discharge status: Home / Self Care | Payer: BC Managed Care – PPO | Source: Ambulatory Visit | Attending: Urology | Admitting: Urology

## 2023-03-01 ENCOUNTER — Ambulatory Visit (INDEPENDENT_AMBULATORY_CARE_PROVIDER_SITE_OTHER): Payer: BC Managed Care – PPO

## 2023-03-01 ENCOUNTER — Ambulatory Visit: Payer: BC Managed Care – PPO | Admitting: Urology

## 2023-03-01 ENCOUNTER — Other Ambulatory Visit: Payer: Self-pay | Admitting: Urology

## 2023-03-01 VITALS — BP 123/84 | HR 83 | Ht 62.0 in | Wt 152.0 lb

## 2023-03-01 DIAGNOSIS — N201 Calculus of ureter: Secondary | ICD-10-CM

## 2023-03-01 LAB — ERYTHROPOIETIN: Erythropoietin: 7.6 m[IU]/mL (ref 2.6–18.5)

## 2023-03-01 NOTE — Progress Notes (Signed)
Assessment: 1. Ureteral calculus, left     Plan: I personally reviewed the patient's chart including provider notes, lab and imaging results. I personally reviewed the CT study from 02/24/2023 with results as noted below. I reviewed the KUB study from today.  The left ureteral calculus is not readily visible on the study.  Diagnosis and treatment options for a ureteral calculus including spontaneous stone passage and ureteroscopic stone manipulation were discussed with Kaitlyn Stone in detail. She has elected to attempt to pass the ureteral calculus. Strain urine, Pain medication as needed.  Rx provided., Anti-emetics as needed.  Rx provided, and Alpha blocker for medical expulsive therapy.  Off label use discussed.  Use and side effects reviewed.  Rx provided. Follow-up in 1 weeks with KUB. Return to office or ER for uncontrolled pain, vomiting, fever, or chills.   Chief Complaint:  Chief Complaint  Patient presents with   Nephrolithiasis    History of Present Illness:  Kaitlyn Stone is a 48 y.o. female who is seen in consultation from Arva Chafe, DO for evaluation of left ureteral calculus. She was seen in the emergency room on 02/24/2023 with left sided abdominal pain.  No flank pain.  She did have some gross hematuria.  No fevers or chills.   CT imaging demonstrated a 7 x 4 mm stone in the mid left ureter with mild left hydronephrosis and perinephric stranding. She has not had any significant pain since that time.  She has been taking ibuprofen.  No dysuria or gross hematuria.  She does have some nausea but no vomiting.  She is tolerating liquids and solid foods.  No fevers or chills.  She has a prior history of nephrolithiasis in 2011.  She underwent ureteroscopic stone manipulation at that time.  Past Medical History:  Past Medical History:  Diagnosis Date   Hyperlipidemia    Hypothyroidism    Rheumatoid arthritis (HCC)     Past Surgical History:  Past  Surgical History:  Procedure Laterality Date   EYE SURGERY Bilateral    x2   HIP SURGERY Right    WRIST SURGERY Right     Allergies:  Allergies  Allergen Reactions   Morphine Hives    Family History:  Family History  Problem Relation Age of Onset   Heart disease Mother    Cancer Mother    Heart disease Father    Diabetes Brother    Healthy Daughter    Polycystic ovary syndrome Daughter    Healthy Daughter    Healthy Daughter     Social History:  Social History   Tobacco Use   Smoking status: Never   Smokeless tobacco: Never  Vaping Use   Vaping status: Never Used  Substance Use Topics   Alcohol use: Never   Drug use: Never    Review of symptoms:  Constitutional:  Negative for unexplained weight loss, night sweats, fever, chills ENT:  Negative for nose bleeds, sinus pain, painful swallowing CV:  Negative for chest pain, shortness of breath, exercise intolerance, palpitations, loss of consciousness Resp:  Negative for cough, wheezing, shortness of breath GI:  Negative for nausea, vomiting, diarrhea, bloody stools GU:  Positives noted in HPI; otherwise negative for gross hematuria, dysuria, urinary incontinence Neuro:  Negative for seizures, poor balance, limb weakness, slurred speech Psych:  Negative for lack of energy, depression, anxiety Endocrine:  Negative for polydipsia, polyuria, symptoms of hypoglycemia (dizziness, hunger, sweating) Hematologic:  Negative for anemia, purpura, petechia, prolonged or excessive bleeding, use  of anticoagulants  Allergic:  Negative for difficulty breathing or choking as a result of exposure to anything; no shellfish allergy; no allergic response (rash/itch) to materials, foods  Physical exam: BP 123/84   Pulse 83   Ht 5\' 2"  (1.575 m)   Wt 152 lb (68.9 kg)   BMI 27.80 kg/m  GENERAL APPEARANCE:  Well appearing, well developed, well nourished, NAD HEENT: Atraumatic, Normocephalic, oropharynx clear. NECK: Supple without  lymphadenopathy or thyromegaly. LUNGS: Clear to auscultation bilaterally. HEART: Regular Rate and Rhythm without murmurs, gallops, or rubs. ABDOMEN: Soft, non-tender, No Masses. EXTREMITIES: Moves all extremities well.  Without clubbing, cyanosis, or edema. NEUROLOGIC:  Alert and oriented x 3, normal gait, CN II-XII grossly intact.  MENTAL STATUS:  Appropriate. BACK:  Non-tender to palpation.  No CVAT SKIN:  Warm, dry and intact.    Results: U/A:  3-10 RBC

## 2023-03-06 ENCOUNTER — Other Ambulatory Visit: Payer: Self-pay

## 2023-03-06 DIAGNOSIS — N201 Calculus of ureter: Secondary | ICD-10-CM

## 2023-03-06 LAB — MICROSCOPIC EXAMINATION

## 2023-03-06 LAB — URINALYSIS, ROUTINE W REFLEX MICROSCOPIC
Bilirubin, UA: NEGATIVE
Glucose, UA: NEGATIVE
Ketones, UA: NEGATIVE
Leukocytes,UA: NEGATIVE
Nitrite, UA: NEGATIVE
Protein,UA: NEGATIVE
Specific Gravity, UA: <1.005 — ABNORMAL LOW (ref 1.005–1.030)
Urobilinogen, Ur: 0.2 mg/dL (ref 0.2–1.0)
pH, UA: 5.5 (ref 5.0–7.5)

## 2023-03-07 ENCOUNTER — Other Ambulatory Visit: Payer: Self-pay

## 2023-03-07 ENCOUNTER — Other Ambulatory Visit (HOSPITAL_BASED_OUTPATIENT_CLINIC_OR_DEPARTMENT_OTHER): Payer: Self-pay

## 2023-03-10 ENCOUNTER — Encounter: Payer: Self-pay | Admitting: Urology

## 2023-03-10 ENCOUNTER — Other Ambulatory Visit (HOSPITAL_BASED_OUTPATIENT_CLINIC_OR_DEPARTMENT_OTHER): Payer: Self-pay

## 2023-03-10 ENCOUNTER — Ambulatory Visit: Payer: BC Managed Care – PPO | Admitting: Urology

## 2023-03-10 ENCOUNTER — Ambulatory Visit (HOSPITAL_BASED_OUTPATIENT_CLINIC_OR_DEPARTMENT_OTHER)
Admission: RE | Admit: 2023-03-10 | Discharge: 2023-03-10 | Disposition: A | Discharge status: Home / Self Care | Payer: BC Managed Care – PPO | Source: Ambulatory Visit | Attending: Urology | Admitting: Urology

## 2023-03-10 VITALS — BP 131/88 | HR 75 | Ht 62.0 in | Wt 156.0 lb

## 2023-03-10 DIAGNOSIS — N200 Calculus of kidney: Secondary | ICD-10-CM | POA: Diagnosis not present

## 2023-03-10 DIAGNOSIS — N201 Calculus of ureter: Secondary | ICD-10-CM | POA: Diagnosis not present

## 2023-03-10 LAB — MICROSCOPIC EXAMINATION
Casts: NONE SEEN /[LPF]
Crystals: NONE SEEN
Renal Epithel, UA: NONE SEEN /[HPF]

## 2023-03-10 LAB — URINALYSIS, ROUTINE W REFLEX MICROSCOPIC
Bilirubin, UA: NEGATIVE
Glucose, UA: NEGATIVE
Ketones, UA: NEGATIVE
Nitrite, UA: NEGATIVE
Protein,UA: NEGATIVE
Specific Gravity, UA: <=1.005 — AB (ref 1.005–1.030)
Urobilinogen, Ur: 0.2 mg/dL (ref 0.2–1.0)
pH, UA: 5.5 (ref 5.0–7.5)

## 2023-03-10 MED ORDER — TAMSULOSIN HCL 0.4 MG PO CAPS: 0.4000 mg | ORAL_CAPSULE | Freq: Every day | ORAL | 0 refills | Status: AC

## 2023-03-10 NOTE — Progress Notes (Signed)
   Assessment: 1. Ureteral calculus     Plan: I personally reviewed the KUB study from today.  A 4 x 5 mm calcification is now seen in the left pelvis consistent with the left ureteral calculus. I discussed these findings with the patient.  It does appear that the stone has moved distally. I again discussed treatment options.  She would like to continue to attempt spontaneous passage. Continue tamsulosin. Continue to strain her urine. Return to office in 10 days with KUB.  Chief Complaint:  Chief Complaint  Patient presents with   Nephrolithiasis    History of Present Illness:  Kaitlyn Stone is a 48 y.o. female who is seen for further evaluation of left ureteral calculus. She was seen in the emergency room on 02/24/2023 with left sided abdominal pain.  No flank pain.  She did have some gross hematuria.  No fevers or chills.   CT imaging demonstrated a 7 x 4 mm stone in the mid left ureter with mild left hydronephrosis and perinephric stranding. She has a prior history of nephrolithiasis in 2011.  She underwent ureteroscopic stone manipulation at that time.  She was in today for follow-up.  She continues to have some mild discomfort now in the left lower quadrant.  She has been on Flomax and has been straining her urine and is not aware of passing a stone.  She is taking ibuprofen for her pain.  No fevers chills, nausea, or vomiting.  No dysuria or gross hematuria.  Past Medical History:  Past Medical History:  Diagnosis Date   Hyperlipidemia    Hypothyroidism    Rheumatoid arthritis (HCC)     Past Surgical History:  Past Surgical History:  Procedure Laterality Date   EYE SURGERY Bilateral    x2   HIP SURGERY Right    WRIST SURGERY Right     Allergies:  Allergies  Allergen Reactions   Morphine Hives    Family History:  Family History  Problem Relation Age of Onset   Heart disease Mother    Cancer Mother    Heart disease Father    Diabetes Brother    Healthy  Daughter    Polycystic ovary syndrome Daughter    Healthy Daughter    Healthy Daughter     Social History:  Social History   Tobacco Use   Smoking status: Never   Smokeless tobacco: Never  Vaping Use   Vaping status: Never Used  Substance Use Topics   Alcohol use: Never   Drug use: Never    ROS: Constitutional:  Negative for fever, chills, weight loss CV: Negative for chest pain, previous MI, hypertension Respiratory:  Negative for shortness of breath, wheezing, sleep apnea, frequent cough GI:  Negative for nausea, vomiting, bloody stool, GERD   Physical exam: BP 131/88   Pulse 75   Ht 5\' 2"  (1.575 m)   Wt 156 lb (70.8 kg)   BMI 28.53 kg/m  GENERAL APPEARANCE:  Well appearing, well developed, well nourished, NAD HEENT:  Atraumatic, normocephalic, oropharynx clear NECK:  Supple without lymphadenopathy or thyromegaly ABDOMEN:  Soft, non-tender, no masses EXTREMITIES:  Moves all extremities well, without clubbing, cyanosis, or edema NEUROLOGIC:  Alert and oriented x 3, normal gait, CN II-XII grossly intact MENTAL STATUS:  appropriate BACK:  Non-tender to palpation, No CVAT SKIN:  Warm, dry, and intact  Results: U/A:  0-5 WBC, 0-2 RBC

## 2023-03-17 ENCOUNTER — Other Ambulatory Visit: Payer: Self-pay | Admitting: Family

## 2023-03-17 DIAGNOSIS — D751 Secondary polycythemia: Secondary | ICD-10-CM

## 2023-03-20 ENCOUNTER — Encounter: Payer: Self-pay | Admitting: Family

## 2023-03-20 ENCOUNTER — Inpatient Hospital Stay: Payer: BC Managed Care – PPO | Admitting: Family

## 2023-03-20 ENCOUNTER — Inpatient Hospital Stay: Payer: BC Managed Care – PPO | Attending: Hematology & Oncology

## 2023-03-20 VITALS — BP 132/76 | HR 72 | Temp 98.5°F | Resp 18 | Ht 62.0 in | Wt 164.1 lb

## 2023-03-20 DIAGNOSIS — D751 Secondary polycythemia: Secondary | ICD-10-CM | POA: Insufficient documentation

## 2023-03-20 DIAGNOSIS — E039 Hypothyroidism, unspecified: Secondary | ICD-10-CM | POA: Diagnosis not present

## 2023-03-20 LAB — CMP (CANCER CENTER ONLY)
ALT: 32 U/L (ref 0–44)
AST: 20 U/L (ref 15–41)
Albumin: 4.4 g/dL (ref 3.5–5.0)
Alkaline Phosphatase: 65 U/L (ref 38–126)
Anion gap: 8 (ref 5–15)
BUN: 16 mg/dL (ref 6–20)
CO2: 28 mmol/L (ref 22–32)
Calcium: 9.5 mg/dL (ref 8.9–10.3)
Chloride: 105 mmol/L (ref 98–111)
Creatinine: 0.99 mg/dL (ref 0.44–1.00)
GFR, Estimated: >60 mL/min (ref >60)
Glucose, Bld: 88 mg/dL (ref 70–99)
Potassium: 4.1 mmol/L (ref 3.5–5.1)
Sodium: 141 mmol/L (ref 135–145)
Total Bilirubin: 0.5 mg/dL (ref 0.0–1.2)
Total Protein: 7.2 g/dL (ref 6.5–8.1)

## 2023-03-20 LAB — CBC WITH DIFFERENTIAL (CANCER CENTER ONLY)
Abs Immature Granulocytes: 0.02 10*3/uL (ref 0.00–0.07)
Basophils Absolute: 0 10*3/uL (ref 0.0–0.1)
Basophils Relative: 0 %
Eosinophils Absolute: 0.2 10*3/uL (ref 0.0–0.5)
Eosinophils Relative: 2 %
HCT: 44.6 % (ref 36.0–46.0)
Hemoglobin: 14.9 g/dL (ref 12.0–15.0)
Immature Granulocytes: 0 %
Lymphocytes Relative: 27 %
Lymphs Abs: 1.8 10*3/uL (ref 0.7–4.0)
MCH: 28.8 pg (ref 26.0–34.0)
MCHC: 33.4 g/dL (ref 30.0–36.0)
MCV: 86.3 fL (ref 80.0–100.0)
Monocytes Absolute: 0.4 10*3/uL (ref 0.1–1.0)
Monocytes Relative: 6 %
Neutro Abs: 4.4 10*3/uL (ref 1.7–7.7)
Neutrophils Relative %: 65 %
Platelet Count: 266 10*3/uL (ref 150–400)
RBC: 5.17 MIL/uL — ABNORMAL HIGH (ref 3.87–5.11)
RDW: 14.6 % (ref 11.5–15.5)
WBC Count: 6.9 10*3/uL (ref 4.0–10.5)
nRBC: 0 % (ref 0.0–0.2)

## 2023-03-20 LAB — RETICULOCYTES
Immature Retic Fract: 11.6 % (ref 2.3–15.9)
RBC.: 5.1 MIL/uL (ref 3.87–5.11)
Retic Count, Absolute: 91.8 10*3/uL (ref 19.0–186.0)
Retic Ct Pct: 1.8 % (ref 0.4–3.1)

## 2023-03-20 LAB — IRON AND IRON BINDING CAPACITY (CC-WL,HP ONLY)
Iron: 91 ug/dL (ref 28–170)
Saturation Ratios: 26 % (ref 10.4–31.8)
TIBC: 356 ug/dL (ref 250–450)
UIBC: 265 ug/dL (ref 148–442)

## 2023-03-20 LAB — FERRITIN: Ferritin: 138 ng/mL (ref 11–307)

## 2023-03-20 LAB — SAVE SMEAR(SSMR), FOR PROVIDER SLIDE REVIEW

## 2023-03-20 LAB — LACTATE DEHYDROGENASE: LDH: 134 U/L (ref 98–192)

## 2023-03-20 NOTE — Progress Notes (Signed)
 Hematology/Oncology Consultation   Name: Kaitlyn Stone      MRN: [id number]    Location: Room/bed info not found  Date: 03/20/2023 Time:10:45 AM   REFERRING PHYSICIAN:  Janyth Pupa P. Wendling, DO  REASON FOR CONSULT: Elevated Hgb   DIAGNOSIS: Mild intermittent erythrocytosis   HISTORY OF PRESENT ILLNESS:  Ms. Kaitlyn Stone is a pleasant 48 yo female with mild intermittent erythrocytosis noted over the last 10 months. She is doing well over all and has no complaints at this time. No itching, redness or flushing of the skin, headaches or new changes in vision.  She denies any issue with fatigue.  She has history of kidney stones most recently last month. She had mild left hydronephrosis and a 7x4 mm stone noted on renal CT. While in the ED. She is followed closely by Dr. Libby Stone and will be having repeat imaging later this week to assess on Thursday. She is currently on Senokot and Flomax. No pain at this time.  She denies fever, chills, n/v, cough, rash, dizziness, SOB, chest pain, palpitations, abdominal pain or changes in bowel or bladder habits.  She has remote history of palpitations. No known liver or spleen issues.  No frequent or recurrent infections.  She has 3 daughters and history of 2 miscarriages.  She has not had a cycle since 2015. She has occasional hot flashes that come and go.  She had hypothyroidism in the past and was on synthroid for a while. This has since resolved and she is no longer on treatment.  No history of diabetes.  She has RA but is not on treatment at this time.  No personal history of cancer. Her mother has lung cancer (smoker).  She states that she has been in touch with GI and will be scheduling her first colonoscopy.  Mammogram in 12/2021 showed dense breast tissue with ho evidence of malignancy.  She has been taking Zepbound for weight loss and was down a total of 50 lbs. This is currently on hold due to kidney stones.  Appetite and hydration are good per  patient. Weight is 164 lbs.  No smoking, ETOH or recreational drug use.  Tingling only noted in her right pinky finger.  No swelling in her extremities.  No falls or syncope reported.  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   ROS: All other 10 point review of systems is negative.   PAST MEDICAL HISTORY:   Past Medical History:  Diagnosis Date   Hyperlipidemia    Hypothyroidism    Rheumatoid arthritis (HCC)     ALLERGIES: Allergies  Allergen Reactions   Morphine Hives      MEDICATIONS:  Current Outpatient Medications on File Prior to Visit  Medication Sig Dispense Refill   ibuprofen (ADVIL) 800 MG tablet Take 1 tablet (800 mg total) by mouth every 8 (eight) hours as needed. 21 tablet 0   ondansetron (ZOFRAN-ODT) 4 MG disintegrating tablet Take 1 tablet (4 mg total) by mouth every 8 (eight) hours as needed. 20 tablet 0   propranolol (INDERAL) 10 MG tablet Take 1 tablet by mouth 3 times daily as needed for anxiety/panic attacks. 30 tablet 1   rosuvastatin (CRESTOR) 10 MG tablet Take 1 tablet (10 mg total) by mouth daily. 30 tablet 3   senna-docusate (SENOKOT-S) 8.6-50 MG tablet Take 1 tablet by mouth at bedtime as needed  for mild constipation. 20 tablet 0   tamsulosin (FLOMAX) 0.4 MG CAPS capsule Take 1 capsule (0.4 mg total) by mouth daily for 14 days. 14 capsule 0   tirzepatide (ZEPBOUND) 5 MG/0.5ML Pen Inject 5 mg into the skin once a week. 2 mL 5   tiZANidine (ZANAFLEX) 4 MG tablet Take 1 tablet (4 mg total) by mouth every 6 (six) hours as needed for muscle spasms. 21 tablet 0   No current facility-administered medications on file prior to visit.     PAST SURGICAL HISTORY Past Surgical History:  Procedure Laterality Date   EYE SURGERY Bilateral    x2   HIP SURGERY Right    WRIST SURGERY Right     FAMILY HISTORY: Family History  Problem  Relation Age of Onset   Heart disease Mother    Cancer Mother    Heart disease Father    Diabetes Brother    Healthy Daughter    Polycystic ovary syndrome Daughter    Healthy Daughter    Healthy Daughter     SOCIAL HISTORY:  reports that she has never smoked. She has never used smokeless tobacco. She reports that she does not drink alcohol and does not use drugs.  PERFORMANCE STATUS: The patient's performance status is 0 - Asymptomatic  PHYSICAL EXAM: Most Recent Vital Signs: There were no vitals taken for this visit. BP 132/76 (BP Location: Left Arm, Patient Position: Sitting)   Pulse 72   Temp 98.5 F (36.9 C) (Oral)   Resp 18   Ht 5\' 2"  (1.575 m)   Wt 164 lb 1.9 oz (74.4 kg)   SpO2 99%   BMI 30.02 kg/m   General Appearance:    Alert, cooperative, no distress, appears stated age  Head:    Normocephalic, without obvious abnormality, atraumatic  Eyes:    PERRL, conjunctiva/corneas clear, EOM's intact, fundi    benign, both eyes        Throat:   Lips, mucosa, and tongue normal; teeth and gums normal  Neck:   Supple, symmetrical, trachea midline, no adenopathy;    thyroid:  no enlargement/tenderness/nodules; no carotid   bruit or JVD  Back:     Symmetric, no curvature, ROM normal, no CVA tenderness  Lungs:     Clear to auscultation bilaterally, respirations unlabored  Chest Wall:    No tenderness or deformity   Heart:    Regular rate and rhythm, S1 and S2 normal, no murmur, rub   or gallop     Abdomen:     Soft, non-tender, bowel sounds active all four quadrants,    no masses, no organomegaly        Extremities:   Extremities normal, atraumatic, no cyanosis or edema  Pulses:   2+ and symmetric all extremities  Skin:   Skin color, texture, turgor normal, no rashes or lesions  Lymph nodes:   Cervical, supraclavicular, and axillary nodes normal  Neurologic:   CNII-XII intact, normal strength, sensation and reflexes    throughout    LABORATORY DATA:  Results for  orders placed or performed in visit on 03/20/23 (from the past 48 hours)  CBC with Differential (Cancer Center Only)     Status: Abnormal   Collection Time: 03/20/23 10:27 AM  Result Value Ref Range   WBC Count 6.9 4.0 - 10.5 K/uL   RBC 5.17 (H) 3.87 - 5.11 MIL/uL   Hemoglobin 14.9 12.0 - 15.0 g/dL   HCT 16.1 09.6 - 04.5 %   MCV  86.3 80.0 - 100.0 fL   MCH 28.8 26.0 - 34.0 pg   MCHC 33.4 30.0 - 36.0 g/dL   RDW 16.1 09.6 - 04.5 %   Platelet Count 266 150 - 400 K/uL   nRBC 0.0 0.0 - 0.2 %   Neutrophils Relative % 65 %   Neutro Abs 4.4 1.7 - 7.7 K/uL   Lymphocytes Relative 27 %   Lymphs Abs 1.8 0.7 - 4.0 K/uL   Monocytes Relative 6 %   Monocytes Absolute 0.4 0.1 - 1.0 K/uL   Eosinophils Relative 2 %   Eosinophils Absolute 0.2 0.0 - 0.5 K/uL   Basophils Relative 0 %   Basophils Absolute 0.0 0.0 - 0.1 K/uL   Immature Granulocytes 0 %   Abs Immature Granulocytes 0.02 0.00 - 0.07 K/uL    Comment: Performed at [facility] at [facility], [address] [place], [place] [address]  Save Smear for Provider Slide Review     Status: None   Collection Time: 03/20/23 10:27 AM  Result Value Ref Range   Smear Review SMEAR STAINED AND AVAILABLE FOR REVIEW     Comment: Performed at [facility] at [facility], [address] [place], [place] [address]  Reticulocytes     Status: None   Collection Time: 03/20/23 10:28 AM  Result Value Ref Range   Retic Ct Pct 1.8 0.4 - 3.1 %   RBC. 5.10 3.87 - 5.11 MIL/uL   Retic Count, Absolute 91.8 19.0 - 186.0 K/uL   Immature Retic Fract 11.6 2.3 - 15.9 %    Comment: Performed at [facility] at [facility], [address], [place], [place] [address]      RADIOGRAPHY: No results found.     PATHOLOGY: None   ASSESSMENT/PLAN: Ms. Hege is a pleasant 48 yo female with mild intermittent erythrocytosis noted over the last 10 months.  She remains asymptomatic.  Hgb is  stable at 14.9, Hct 44.6 and MCV 86. Epo level and iron studies are within normal limits.  IntelliGen Myeloid test is pending.  Follow-up pending those results.   All questions were answered. The patient knows to call the clinic with any problems, questions or concerns. We can certainly see the patient much sooner if necessary.  The patient was discussed with Dr. Myna Hidalgo and he is in agreement with the aforementioned.   Ericia Stanford, NP

## 2023-03-21 LAB — ERYTHROPOIETIN: Erythropoietin: 10.1 m[IU]/mL (ref 2.6–18.5)

## 2023-03-23 ENCOUNTER — Ambulatory Visit: Payer: Self-pay | Admitting: Urology

## 2023-03-23 ENCOUNTER — Encounter: Payer: Self-pay | Admitting: Urology

## 2023-03-23 ENCOUNTER — Ambulatory Visit: Payer: BC Managed Care – PPO | Admitting: Urology

## 2023-03-23 ENCOUNTER — Ambulatory Visit (HOSPITAL_BASED_OUTPATIENT_CLINIC_OR_DEPARTMENT_OTHER)
Admission: RE | Admit: 2023-03-23 | Discharge: 2023-03-23 | Disposition: A | Discharge status: Home / Self Care | Payer: BC Managed Care – PPO | Source: Ambulatory Visit | Attending: Urology | Admitting: Urology

## 2023-03-23 ENCOUNTER — Other Ambulatory Visit: Payer: Self-pay

## 2023-03-23 VITALS — BP 122/83 | HR 87 | Ht 62.0 in | Wt 160.0 lb

## 2023-03-23 DIAGNOSIS — N201 Calculus of ureter: Secondary | ICD-10-CM | POA: Insufficient documentation

## 2023-03-23 LAB — URINALYSIS, ROUTINE W REFLEX MICROSCOPIC
Bilirubin, UA: NEGATIVE
Glucose, UA: NEGATIVE
Ketones, UA: NEGATIVE
Leukocytes,UA: NEGATIVE
Nitrite, UA: NEGATIVE
Protein,UA: NEGATIVE
RBC, UA: NEGATIVE
Specific Gravity, UA: 1.02 (ref 1.005–1.030)
Urobilinogen, Ur: 1 mg/dL (ref 0.2–1.0)
pH, UA: 6.5 (ref 5.0–7.5)

## 2023-03-23 NOTE — H&P (View-Only) (Signed)
 Assessment: 1. Ureteral calculus, left     Plan: I reviewed the KUB from today.  A 4 x 6 mm calcification remains in the left pelvis consistent with a distal ureteral calculus.  This does not appear to be significantly changed from the study on 03/10/2023. Treatment options again discussed with the patient including continued spontaneous passage, shockwave lithotripsy, and ureteroscopic stone manipulation.  She would like to proceed with shockwave lithotripsy. Continue tamsulosin. Continue to strain her urine.  Procedure: The patient will be scheduled for left ESL at [facility].  Surgical request is placed with the surgery schedulers and will be scheduled at the patient's/family request. Informed consent is given as documented below. Anesthesia:  local with IV sedation  The patient does not have sleep apnea, history of MRSA, history of VRE, history of cardiac device requiring special anesthetic needs. Patient is stable and considered clear for surgical in an outpatient ambulatory surgery setting as well as patient hospital setting.  Consent for Operation or Procedure: Provider Certification I hereby certify that the nature, purpose, benefits, usual and most frequent risks of, and alternatives to, the operation or procedure have been explained to the patient (or person authorized to sign for the patient) either by me as responsible physician or by the provider who is to perform the operation or procedure. Time spent such that the patient/family has had an opportunity to ask questions, and that those questions have been answered. The patient or the patient's representative has been advised that selected tasks may be performed by assistants to the primary health care provider(s). I believe that the patient (or person authorized to sign for the patient) understands what has been explained, and has consented to the operation or procedure. No guarantees were implied or made.   Chief Complaint:   Chief Complaint  Patient presents with   Nephrolithiasis    History of Present Illness:  Kaitlyn Stone is a 48 y.o. female who is seen for further evaluation of left ureteral calculus. She was seen in the emergency room on 02/24/2023 with left sided abdominal pain.  No flank pain.  She did have some gross hematuria.  No fevers or chills.   CT imaging demonstrated a 7 x 4 mm stone in the mid left ureter with mild left hydronephrosis and perinephric stranding. She has a prior history of nephrolithiasis in 2011.  She underwent ureteroscopic stone manipulation at that time. At her visit on 03/10/23, she continued to have some mild discomfort now in the left lower quadrant.  She was on Flomax and was straining her urine.  She was not aware of passing a stone.  She was taking ibuprofen for her pain.  No fevers chills, nausea, or vomiting.  No dysuria or gross hematuria. KUB from 03/10/2023 showed a 4 x 5 mm calcification in the left pelvis consistent with the left ureteral calculus. Treatment options were again discussed.  She elected to continue to attempt spontaneous passage.  She returns today for follow-up.  She has not passed the stone.  She has occasional mild discomfort in the left lower quadrant.  No fevers or chills.  No nausea or vomiting.  No dysuria or gross hematuria.  Portions of the above documentation were copied from a prior visit for review purposes only.   Past Medical History:  Past Medical History:  Diagnosis Date   Hyperlipidemia    Hypothyroidism    Rheumatoid arthritis (HCC)     Past Surgical History:  Past Surgical History:  Procedure  Laterality Date   EYE SURGERY Bilateral    x2   HIP SURGERY Right    WRIST SURGERY Right     Allergies:  Allergies  Allergen Reactions   Morphine Hives, Itching and Rash    "Face looked like a sunburn."    Family History:  Family History  Problem Relation Age of Onset   Heart disease Mother    Cancer Mother    Heart  disease Father    Diabetes Brother    Healthy Daughter    Polycystic ovary syndrome Daughter    Healthy Daughter    Healthy Daughter     Social History:  Social History   Tobacco Use   Smoking status: Never   Smokeless tobacco: Never  Vaping Use   Vaping status: Never Used  Substance Use Topics   Alcohol use: Never   Drug use: Never    ROS: Constitutional:  Negative for fever, chills, weight loss CV: Negative for chest pain, previous MI, hypertension Respiratory:  Negative for shortness of breath, wheezing, sleep apnea, frequent cough GI:  Negative for nausea, vomiting, bloody stool, GERD   Physical exam: BP 122/83   Pulse 87   Ht 5\' 2"  (1.575 m)   Wt 160 lb (72.6 kg)   BMI 29.26 kg/m  GENERAL APPEARANCE:  Well appearing, well developed, well nourished, NAD HEENT:  Atraumatic, normocephalic, oropharynx clear NECK:  Supple without lymphadenopathy or thyromegaly ABDOMEN:  Soft, non-tender, no masses EXTREMITIES:  Moves all extremities well, without clubbing, cyanosis, or edema NEUROLOGIC:  Alert and oriented x 3, normal gait, CN II-XII grossly intact MENTAL STATUS:  appropriate BACK:  Non-tender to palpation, No CVAT SKIN:  Warm, dry, and intact  Results: U/A:  negative

## 2023-03-23 NOTE — Progress Notes (Signed)
Assessment: 1. Ureteral calculus, left     Plan: I reviewed the KUB from today.  A 4 x 6 mm calcification remains in the left pelvis consistent with a distal ureteral calculus.  This does not appear to be significantly changed from the study on 03/10/2023. Treatment options again discussed with the patient including continued spontaneous passage, shockwave lithotripsy, and ureteroscopic stone manipulation.  She would like to proceed with shockwave lithotripsy. Continue tamsulosin. Continue to strain her urine.  Procedure: The patient will be scheduled for left ESL at [facility].  Surgical request is placed with the surgery schedulers and will be scheduled at the patient's/family request. Informed consent is given as documented below. Anesthesia:  local with IV sedation  The patient does not have sleep apnea, history of MRSA, history of VRE, history of cardiac device requiring special anesthetic needs. Patient is stable and considered clear for surgical in an outpatient ambulatory surgery setting as well as patient hospital setting.  Consent for Operation or Procedure: Provider Certification I hereby certify that the nature, purpose, benefits, usual and most frequent risks of, and alternatives to, the operation or procedure have been explained to the patient (or person authorized to sign for the patient) either by me as responsible physician or by the provider who is to perform the operation or procedure. Time spent such that the patient/family has had an opportunity to ask questions, and that those questions have been answered. The patient or the patient's representative has been advised that selected tasks may be performed by assistants to the primary health care provider(s). I believe that the patient (or person authorized to sign for the patient) understands what has been explained, and has consented to the operation or procedure. No guarantees were implied or made.   Chief Complaint:   Chief Complaint  Patient presents with   Nephrolithiasis    History of Present Illness:  Kaitlyn Stone is a 48 y.o. female who is seen for further evaluation of left ureteral calculus. She was seen in the emergency room on 02/24/2023 with left sided abdominal pain.  No flank pain.  She did have some gross hematuria.  No fevers or chills.   CT imaging demonstrated a 7 x 4 mm stone in the mid left ureter with mild left hydronephrosis and perinephric stranding. She has a prior history of nephrolithiasis in 2011.  She underwent ureteroscopic stone manipulation at that time. At her visit on 03/10/23, she continued to have some mild discomfort now in the left lower quadrant.  She was on Flomax and was straining her urine.  She was not aware of passing a stone.  She was taking ibuprofen for her pain.  No fevers chills, nausea, or vomiting.  No dysuria or gross hematuria. KUB from 03/10/2023 showed a 4 x 5 mm calcification in the left pelvis consistent with the left ureteral calculus. Treatment options were again discussed.  She elected to continue to attempt spontaneous passage.  She returns today for follow-up.  She has not passed the stone.  She has occasional mild discomfort in the left lower quadrant.  No fevers or chills.  No nausea or vomiting.  No dysuria or gross hematuria.  Portions of the above documentation were copied from a prior visit for review purposes only.   Past Medical History:  Past Medical History:  Diagnosis Date   Hyperlipidemia    Hypothyroidism    Rheumatoid arthritis (HCC)     Past Surgical History:  Past Surgical History:  Procedure  Laterality Date   EYE SURGERY Bilateral    x2   HIP SURGERY Right    WRIST SURGERY Right     Allergies:  Allergies  Allergen Reactions   Morphine Hives, Itching and Rash    "Face looked like a sunburn."    Family History:  Family History  Problem Relation Age of Onset   Heart disease Mother    Cancer Mother    Heart  disease Father    Diabetes Brother    Healthy Daughter    Polycystic ovary syndrome Daughter    Healthy Daughter    Healthy Daughter     Social History:  Social History   Tobacco Use   Smoking status: Never   Smokeless tobacco: Never  Vaping Use   Vaping status: Never Used  Substance Use Topics   Alcohol use: Never   Drug use: Never    ROS: Constitutional:  Negative for fever, chills, weight loss CV: Negative for chest pain, previous MI, hypertension Respiratory:  Negative for shortness of breath, wheezing, sleep apnea, frequent cough GI:  Negative for nausea, vomiting, bloody stool, GERD   Physical exam: BP 122/83   Pulse 87   Ht 5\' 2"  (1.575 m)   Wt 160 lb (72.6 kg)   BMI 29.26 kg/m  GENERAL APPEARANCE:  Well appearing, well developed, well nourished, NAD HEENT:  Atraumatic, normocephalic, oropharynx clear NECK:  Supple without lymphadenopathy or thyromegaly ABDOMEN:  Soft, non-tender, no masses EXTREMITIES:  Moves all extremities well, without clubbing, cyanosis, or edema NEUROLOGIC:  Alert and oriented x 3, normal gait, CN II-XII grossly intact MENTAL STATUS:  appropriate BACK:  Non-tender to palpation, No CVAT SKIN:  Warm, dry, and intact  Results: U/A:  negative

## 2023-03-24 ENCOUNTER — Encounter (HOSPITAL_COMMUNITY): Payer: Self-pay | Admitting: Urology

## 2023-03-27 ENCOUNTER — Ambulatory Visit (HOSPITAL_COMMUNITY)
Admission: RE | Admit: 2023-03-27 | Discharge: 2023-03-27 | Disposition: A | Discharge status: Home / Self Care | Payer: BC Managed Care – PPO | Attending: Urology | Admitting: Urology

## 2023-03-27 ENCOUNTER — Other Ambulatory Visit: Payer: Self-pay

## 2023-03-27 ENCOUNTER — Ambulatory Visit (HOSPITAL_COMMUNITY): Payer: BC Managed Care – PPO

## 2023-03-27 ENCOUNTER — Encounter (HOSPITAL_COMMUNITY)
Admission: RE | Disposition: A | Discharge status: Home / Self Care | Payer: Self-pay | Source: Home / Self Care | Attending: Urology

## 2023-03-27 ENCOUNTER — Encounter (HOSPITAL_COMMUNITY): Payer: Self-pay | Admitting: Urology

## 2023-03-27 DIAGNOSIS — N201 Calculus of ureter: Secondary | ICD-10-CM | POA: Insufficient documentation

## 2023-03-27 DIAGNOSIS — Z6829 Body mass index (BMI) 29.0-29.9, adult: Secondary | ICD-10-CM | POA: Insufficient documentation

## 2023-03-27 DIAGNOSIS — I499 Cardiac arrhythmia, unspecified: Secondary | ICD-10-CM | POA: Insufficient documentation

## 2023-03-27 DIAGNOSIS — E669 Obesity, unspecified: Secondary | ICD-10-CM | POA: Diagnosis not present

## 2023-03-27 HISTORY — PX: EXTRACORPOREAL SHOCK WAVE LITHOTRIPSY: SHX1557

## 2023-03-27 SURGERY — EXTRACORPOREAL SHOCK WAVE LITHOTRIPSY (ESWL)
Anesthesia: LOCAL | Laterality: Left

## 2023-03-27 MED ORDER — DIAZEPAM 5 MG PO TABS
10.0000 mg | ORAL_TABLET | ORAL | Status: AC
  Administered 2023-03-27: 10 mg via ORAL
  Filled 2023-03-27: qty 2

## 2023-03-27 MED ORDER — KETOROLAC TROMETHAMINE 30 MG/ML IJ SOLN
30.0000 mg | Freq: Once | INTRAMUSCULAR | Status: DC
  Filled 2023-03-27: qty 1

## 2023-03-27 MED ORDER — CIPROFLOXACIN HCL 500 MG PO TABS
500.0000 mg | ORAL_TABLET | ORAL | Status: AC
Start: 2023-03-27 — End: 2023-03-27
  Administered 2023-03-27: 500 mg via ORAL
  Filled 2023-03-27: qty 1

## 2023-03-27 MED ORDER — DIPHENHYDRAMINE HCL 25 MG PO CAPS
25.0000 mg | ORAL_CAPSULE | ORAL | Status: AC
Start: 2023-03-27 — End: 2023-03-27
  Administered 2023-03-27: 25 mg via ORAL
  Filled 2023-03-27: qty 1

## 2023-03-27 MED ORDER — SODIUM CHLORIDE 0.9 % IV SOLN: INTRAVENOUS | Status: DC

## 2023-03-27 NOTE — Discharge Instructions (Signed)

## 2023-03-27 NOTE — Interval H&P Note (Signed)
History and Physical Interval Note:  03/27/2023 1:06 PM  Kaitlyn Stone  has presented today for surgery, with the diagnosis of left ureteral stone.  The various methods of treatment have been discussed with the patient and family. After consideration of risks, benefits and other options for treatment, the patient has consented to  Procedure(s): EXTRACORPOREAL SHOCK WAVE LITHOTRIPSY (ESWL) (Left) as a surgical intervention.  The patient's history has been reviewed, patient examined, no change in status, stable for surgery.  I have reviewed the patient's chart and labs.  Questions were answered to the patient's satisfaction.     [name]

## 2023-03-28 ENCOUNTER — Other Ambulatory Visit: Payer: Self-pay | Admitting: Urology

## 2023-03-28 ENCOUNTER — Encounter: Payer: Self-pay | Admitting: Urology

## 2023-03-28 ENCOUNTER — Encounter (HOSPITAL_COMMUNITY): Payer: Self-pay | Admitting: Urology

## 2023-03-28 DIAGNOSIS — N201 Calculus of ureter: Secondary | ICD-10-CM

## 2023-03-28 MED ORDER — OXYCODONE HCL 5 MG PO TABS: 5.0000 mg | ORAL_TABLET | Freq: Four times a day (QID) | ORAL | 0 refills | Status: DC | PRN

## 2023-03-30 ENCOUNTER — Encounter: Payer: Self-pay | Admitting: Family Medicine

## 2023-03-30 ENCOUNTER — Other Ambulatory Visit: Payer: Self-pay | Admitting: Family Medicine

## 2023-03-30 MED ORDER — ZEPBOUND 5 MG/0.5ML ~~LOC~~ SOAJ
5.0000 mg | SUBCUTANEOUS | 5 refills | Status: DC
  Filled 2023-03-30 – 2023-04-27 (×2): qty 2, 28d supply, fill #0
  Filled 2023-05-23: qty 2, 28d supply, fill #1
  Filled 2023-06-18: qty 2, 28d supply, fill #2
  Filled 2023-07-20: qty 2, 28d supply, fill #3
  Filled 2023-08-29: qty 2, 28d supply, fill #4
  Filled 2023-08-29: qty 2, 28d supply, fill #0
  Filled 2023-10-20: qty 2, 28d supply, fill #1

## 2023-03-30 MED ORDER — ZEPBOUND 2.5 MG/0.5ML ~~LOC~~ SOAJ
2.5000 mg | SUBCUTANEOUS | 0 refills | Status: AC
  Filled 2023-03-30: qty 2, 28d supply, fill #0

## 2023-03-30 NOTE — Op Note (Signed)
Urology OP Note--  Patient underwent left ESWL See scanned OP note from [facility].

## 2023-03-31 ENCOUNTER — Other Ambulatory Visit (HOSPITAL_BASED_OUTPATIENT_CLINIC_OR_DEPARTMENT_OTHER): Payer: Self-pay

## 2023-03-31 DIAGNOSIS — E039 Hypothyroidism, unspecified: Secondary | ICD-10-CM | POA: Diagnosis not present

## 2023-03-31 DIAGNOSIS — R002 Palpitations: Secondary | ICD-10-CM | POA: Diagnosis not present

## 2023-03-31 DIAGNOSIS — I493 Ventricular premature depolarization: Secondary | ICD-10-CM | POA: Diagnosis not present

## 2023-03-31 DIAGNOSIS — I491 Atrial premature depolarization: Secondary | ICD-10-CM | POA: Diagnosis not present

## 2023-04-06 LAB — INTELLIGEN MYELOID

## 2023-04-07 ENCOUNTER — Telehealth: Payer: Self-pay | Admitting: Family

## 2023-04-07 NOTE — Telephone Encounter (Signed)
 I was able to speak with the patient and go over lab work including negative [employer] Myloid panel. No questions or concerns. Patient appreciative of call. No follow-up needed.

## 2023-04-11 ENCOUNTER — Other Ambulatory Visit: Payer: Self-pay

## 2023-04-11 DIAGNOSIS — N201 Calculus of ureter: Secondary | ICD-10-CM

## 2023-04-12 ENCOUNTER — Encounter: Payer: BC Managed Care – PPO | Admitting: Urology

## 2023-04-12 ENCOUNTER — Encounter: Payer: Self-pay | Admitting: Urology

## 2023-04-12 NOTE — Progress Notes (Deleted)
 Assessment: 1. Ureteral calculus, left     Plan:   Chief Complaint:  No chief complaint on file.   History of Present Illness:  Kaitlyn Stone is a 48 y.o. female who is seen for further evaluation of left ureteral calculus. She was seen in the emergency room on 02/24/2023 with left sided abdominal pain.  No flank pain.  She did have some gross hematuria.  No fevers or chills.   CT imaging demonstrated a 7 x 4 mm stone in the mid left ureter with mild left hydronephrosis and perinephric stranding. She has a prior history of nephrolithiasis in 2011.  She underwent ureteroscopic stone manipulation at that time. At her visit on 03/10/23, she continued to have some mild discomfort now in the left lower quadrant.  She was on Flomax and was straining her urine.  She was not aware of passing a stone.  She was taking ibuprofen for her pain.  No fevers chills, nausea, or vomiting.  No dysuria or gross hematuria. KUB from 03/10/2023 showed a 4 x 5 mm calcification in the left pelvis consistent with the left ureteral calculus. Treatment options were again discussed.  She elected to continue to attempt spontaneous passage. KUB from 03/23/2023 showed persistence of a 4 x 6 mm calcification in the left pelvis consistent with a left ureteral calculus. She underwent left ESL on 03/27/2023.  Portions of the above documentation were copied from a prior visit for review purposes only.   Past Medical History:  Past Medical History:  Diagnosis Date   Hyperlipidemia    Hypothyroidism    Rheumatoid arthritis (HCC)     Past Surgical History:  Past Surgical History:  Procedure Laterality Date   EXTRACORPOREAL SHOCK WAVE LITHOTRIPSY Left 03/27/2023   Procedure: EXTRACORPOREAL SHOCK WAVE LITHOTRIPSY (ESWL);  Surgeon: [name], MD;  Location: [facility];  Service: Urology;  Laterality: Left;   EYE SURGERY Bilateral    x2   HIP SURGERY Right    WRIST SURGERY Right     Allergies:  Allergies   Allergen Reactions   Morphine Hives, Itching and Rash    "Face looked like a sunburn."    Family History:  Family History  Problem Relation Age of Onset   Heart disease Mother    Cancer Mother    Heart disease Father    Diabetes Brother    Healthy Daughter    Polycystic ovary syndrome Daughter    Healthy Daughter    Healthy Daughter     Social History:  Social History   Tobacco Use   Smoking status: Never   Smokeless tobacco: Never  Vaping Use   Vaping status: Never Used  Substance Use Topics   Alcohol use: Never   Drug use: Never    ROS: Constitutional:  Negative for fever, chills, weight loss CV: Negative for chest pain, previous MI, hypertension Respiratory:  Negative for shortness of breath, wheezing, sleep apnea, frequent cough GI:  Negative for nausea, vomiting, bloody stool, GERD   Physical exam: There were no vitals taken for this visit. GENERAL APPEARANCE:  Well appearing, well developed, well nourished, NAD HEENT:  Atraumatic, normocephalic, oropharynx clear NECK:  Supple without lymphadenopathy or thyromegaly ABDOMEN:  Soft, non-tender, no masses EXTREMITIES:  Moves all extremities well, without clubbing, cyanosis, or edema NEUROLOGIC:  Alert and oriented x 3, normal gait, CN II-XII grossly intact MENTAL STATUS:  appropriate BACK:  Non-tender to palpation, No CVAT SKIN:  Warm, dry, and intact  Results: U/A:

## 2023-04-27 ENCOUNTER — Ambulatory Visit (HOSPITAL_BASED_OUTPATIENT_CLINIC_OR_DEPARTMENT_OTHER)
Admission: RE | Admit: 2023-04-27 | Discharge: 2023-04-27 | Disposition: A | Discharge status: Home / Self Care | Source: Ambulatory Visit | Attending: Urology | Admitting: Urology

## 2023-04-27 ENCOUNTER — Encounter: Payer: Self-pay | Admitting: Urology

## 2023-04-27 ENCOUNTER — Other Ambulatory Visit (HOSPITAL_BASED_OUTPATIENT_CLINIC_OR_DEPARTMENT_OTHER): Payer: Self-pay

## 2023-04-27 ENCOUNTER — Other Ambulatory Visit: Payer: Self-pay

## 2023-04-27 ENCOUNTER — Ambulatory Visit: Admitting: Urology

## 2023-04-27 VITALS — BP 139/89 | HR 76 | Ht 62.0 in | Wt 158.0 lb

## 2023-04-27 DIAGNOSIS — N200 Calculus of kidney: Secondary | ICD-10-CM | POA: Diagnosis not present

## 2023-04-27 DIAGNOSIS — N201 Calculus of ureter: Secondary | ICD-10-CM | POA: Diagnosis not present

## 2023-04-27 LAB — URINALYSIS, ROUTINE W REFLEX MICROSCOPIC
Bilirubin, UA: NEGATIVE
Glucose, UA: NEGATIVE
Ketones, UA: NEGATIVE
Leukocytes,UA: NEGATIVE
Nitrite, UA: NEGATIVE
Protein,UA: NEGATIVE
RBC, UA: NEGATIVE
Specific Gravity, UA: 1.01 (ref 1.005–1.030)
Urobilinogen, Ur: 0.2 mg/dL (ref 0.2–1.0)
pH, UA: 7 (ref 5.0–7.5)

## 2023-04-27 MED ORDER — TAMSULOSIN HCL 0.4 MG PO CAPS
0.4000 mg | ORAL_CAPSULE | Freq: Every day | ORAL | 1 refills | Status: AC
  Filled 2023-04-27: qty 14, 14d supply, fill #0
  Filled 2023-08-04: qty 14, 14d supply, fill #1

## 2023-04-27 NOTE — Progress Notes (Signed)
 Assessment: 1. Ureteral calculus, left     Plan: I reviewed the KUB from today.  There appears to be a persistent 3 x 4 mm calcification in the left pelvis consistent with a distal ureteral fragment following lithotripsy. I discussed these results with the patient today.  She is currently asymptomatic.  I think it be reasonable to give her a little more time to see if she is able to pass any remaining fragments. Resume tamsulosin.  Prescription sent. Strain urine. Return to office in 10-14 days with previsit KUB.  Chief Complaint:  Chief Complaint  Patient presents with   ureteral calculus    History of Present Illness:  Kaitlyn Stone is a 48 y.o. female who is seen for further evaluation of left ureteral calculus. She was seen in the emergency room on 02/24/2023 with left sided abdominal pain.  No flank pain.  She did have some gross hematuria.  No fevers or chills.   CT imaging demonstrated a 7 x 4 mm stone in the mid left ureter with mild left hydronephrosis and perinephric stranding. She has a prior history of nephrolithiasis in 2011.  She underwent ureteroscopic stone manipulation at that time. At her visit on 03/10/23, she continued to have some mild discomfort now in the left lower quadrant.  She was on Flomax and was straining her urine.  She was not aware of passing a stone.  She was taking ibuprofen for her pain.  No fevers chills, nausea, or vomiting.  No dysuria or gross hematuria. KUB from 03/10/2023 showed a 4 x 5 mm calcification in the left pelvis consistent with the left ureteral calculus. Treatment options were again discussed.  She elected to continue to attempt spontaneous passage. KUB from 03/23/2023 showed persistence of a 4 x 6 mm calcification in the left pelvis consistent with a left ureteral calculus. She underwent left ESL on 03/27/2023.  She presents today for follow-up.  She has passed some small stone fragments and gravel.  She has not had any left-sided  flank pain or abdominal pain.  No fevers or chills.  No dysuria or gross hematuria.  She has not been taking tamsulosin.  Portions of the above documentation were copied from a prior visit for review purposes only.   Past Medical History:  Past Medical History:  Diagnosis Date   Hyperlipidemia    Hypothyroidism    Rheumatoid arthritis (HCC)     Past Surgical History:  Past Surgical History:  Procedure Laterality Date   EXTRACORPOREAL SHOCK WAVE LITHOTRIPSY Left 03/27/2023   Procedure: EXTRACORPOREAL SHOCK WAVE LITHOTRIPSY (ESWL);  Surgeon: [name], MD;  Location: [facility];  Service: Urology;  Laterality: Left;   EYE SURGERY Bilateral    x2   HIP SURGERY Right    WRIST SURGERY Right     Allergies:  Allergies  Allergen Reactions   Morphine Hives, Itching and Rash    "Face looked like a sunburn."    Family History:  Family History  Problem Relation Age of Onset   Heart disease Mother    Cancer Mother    Heart disease Father    Diabetes Brother    Healthy Daughter    Polycystic ovary syndrome Daughter    Healthy Daughter    Healthy Daughter     Social History:  Social History   Tobacco Use   Smoking status: Never   Smokeless tobacco: Never  Vaping Use   Vaping status: Never Used  Substance Use Topics   Alcohol  use: Never   Drug use: Never    ROS: Constitutional:  Negative for fever, chills, weight loss CV: Negative for chest pain, previous MI, hypertension Respiratory:  Negative for shortness of breath, wheezing, sleep apnea, frequent cough GI:  Negative for nausea, vomiting, bloody stool, GERD   Physical exam: BP 139/89   Pulse 76   Ht 5\' 2"  (1.575 m)   Wt 158 lb (71.7 kg)   BMI 28.90 kg/m  GENERAL APPEARANCE:  Well appearing, well developed, well nourished, NAD HEENT:  Atraumatic, normocephalic, oropharynx clear NECK:  Supple without lymphadenopathy or thyromegaly ABDOMEN:  Soft, non-tender, no masses EXTREMITIES:  Moves all extremities  well, without clubbing, cyanosis, or edema NEUROLOGIC:  Alert and oriented x 3, normal gait, CN II-XII grossly intact MENTAL STATUS:  appropriate BACK:  Non-tender to palpation, No CVAT SKIN:  Warm, dry, and intact  Results: U/A:  negative

## 2023-05-06 ENCOUNTER — Other Ambulatory Visit: Payer: Self-pay | Admitting: Family Medicine

## 2023-05-08 ENCOUNTER — Other Ambulatory Visit (HOSPITAL_COMMUNITY): Payer: Self-pay

## 2023-05-08 ENCOUNTER — Other Ambulatory Visit: Payer: Self-pay

## 2023-05-08 MED ORDER — ROSUVASTATIN CALCIUM 10 MG PO TABS
10.0000 mg | ORAL_TABLET | Freq: Every day | ORAL | 3 refills | Status: AC
  Filled 2023-05-08 – 2023-08-29 (×3): qty 90, 90d supply, fill #0
  Filled 2023-08-29: qty 90, 90d supply, fill #1

## 2023-05-09 ENCOUNTER — Other Ambulatory Visit: Payer: Self-pay

## 2023-05-12 ENCOUNTER — Other Ambulatory Visit: Payer: Self-pay

## 2023-05-15 ENCOUNTER — Ambulatory Visit (HOSPITAL_BASED_OUTPATIENT_CLINIC_OR_DEPARTMENT_OTHER)
Admission: RE | Admit: 2023-05-15 | Discharge: 2023-05-15 | Disposition: A | Discharge status: Home / Self Care | Source: Ambulatory Visit | Attending: Urology | Admitting: Urology

## 2023-05-15 DIAGNOSIS — R109 Unspecified abdominal pain: Secondary | ICD-10-CM | POA: Diagnosis not present

## 2023-05-15 DIAGNOSIS — I878 Other specified disorders of veins: Secondary | ICD-10-CM | POA: Diagnosis not present

## 2023-05-15 DIAGNOSIS — N201 Calculus of ureter: Secondary | ICD-10-CM

## 2023-05-17 ENCOUNTER — Ambulatory Visit: Admitting: Urology

## 2023-05-17 ENCOUNTER — Encounter: Payer: Self-pay | Admitting: Urology

## 2023-05-17 VITALS — BP 116/79 | HR 77 | Ht 62.0 in | Wt 156.0 lb

## 2023-05-17 DIAGNOSIS — N201 Calculus of ureter: Secondary | ICD-10-CM

## 2023-05-17 LAB — URINALYSIS, ROUTINE W REFLEX MICROSCOPIC
Bilirubin, UA: NEGATIVE
Glucose, UA: NEGATIVE
Ketones, UA: NEGATIVE
Nitrite, UA: NEGATIVE
Protein,UA: NEGATIVE
RBC, UA: NEGATIVE
Specific Gravity, UA: 1.015 (ref 1.005–1.030)
Urobilinogen, Ur: 0.2 mg/dL (ref 0.2–1.0)
pH, UA: 7 (ref 5.0–7.5)

## 2023-05-17 LAB — MICROSCOPIC EXAMINATION

## 2023-05-17 NOTE — Progress Notes (Signed)
 Assessment: 1. Ureteral calculus, left     Plan: I reviewed the KUB from 05/15/2023.  The previously noted calcification in the area of the left distal ureter is no longer present.   Stone prevention discussed with the patient and information provided. Return to office prn  Chief Complaint:  Chief Complaint  Patient presents with   Nephrolithiasis    History of Present Illness:  Kaitlyn Stone is a 48 y.o. female who is seen for further evaluation of left ureteral calculus. She was seen in the emergency room on 02/24/2023 with left sided abdominal pain.  No flank pain.  She did have some gross hematuria.  No fevers or chills.   CT imaging demonstrated a 7 x 4 mm stone in the mid left ureter with mild left hydronephrosis and perinephric stranding. She has a prior history of nephrolithiasis in 2011.  She underwent ureteroscopic stone manipulation at that time. At her visit on 03/10/23, she continued to have some mild discomfort now in the left lower quadrant.  She was on Flomax and was straining her urine.  She was not aware of passing a stone.  She was taking ibuprofen for her pain.  No fevers chills, nausea, or vomiting.  No dysuria or gross hematuria. KUB from 03/10/2023 showed a 4 x 5 mm calcification in the left pelvis consistent with the left ureteral calculus. Treatment options were again discussed.  She elected to continue to attempt spontaneous passage. KUB from 03/23/2023 showed persistence of a 4 x 6 mm calcification in the left pelvis consistent with a left ureteral calculus. She underwent left ESL on 03/27/2023.  At her visit on 05-02-2023, she had passed some small stone fragments and gravel.  No left-sided flank pain or abdominal pain.  No fevers or chills.  No dysuria or gross hematuria.  She had not been taking tamsulosin. KUB from May 02, 2023 showed a persistent 3 x 4 mm calcification in the left pelvis consistent with a distal ureteral fragment following lithotripsy.  KUB from  05/15/23 did not show any obvious calcifications along the expected course of the left ureter or either renal shadow.  She returns today for follow-up.  She had an episode of left-sided flank pain last week.  She is not aware of passing a stone but did not strain her urine on a regular basis.  No current symptoms.  No dysuria or gross hematuria.   Portions of the above documentation were copied from a prior visit for review purposes only.   Past Medical History:  Past Medical History:  Diagnosis Date   Hyperlipidemia    Hypothyroidism    Rheumatoid arthritis (HCC)     Past Surgical History:  Past Surgical History:  Procedure Laterality Date   EXTRACORPOREAL SHOCK WAVE LITHOTRIPSY Left 03/27/2023   Procedure: EXTRACORPOREAL SHOCK WAVE LITHOTRIPSY (ESWL);  Surgeon: [name], MD;  Location: [facility];  Service: Urology;  Laterality: Left;   EYE SURGERY Bilateral    x2   HIP SURGERY Right    WRIST SURGERY Right     Allergies:  Allergies  Allergen Reactions   Morphine Hives, Itching and Rash    "Face looked like a sunburn."    Family History:  Family History  Problem Relation Age of Onset   Heart disease Mother    Cancer Mother    Heart disease Father    Diabetes Brother    Healthy Daughter    Polycystic ovary syndrome Daughter    Healthy Daughter  Healthy Daughter     Social History:  Social History   Tobacco Use   Smoking status: Never   Smokeless tobacco: Never  Vaping Use   Vaping status: Never Used  Substance Use Topics   Alcohol use: Never   Drug use: Never    ROS: Constitutional:  Negative for fever, chills, weight loss CV: Negative for chest pain, previous MI, hypertension Respiratory:  Negative for shortness of breath, wheezing, sleep apnea, frequent cough GI:  Negative for nausea, vomiting, bloody stool, GERD   Physical exam: BP 116/79   Pulse 77   Ht 5\' 2"  (1.575 m)   Wt 156 lb (70.8 kg)   BMI 28.53 kg/m  GENERAL APPEARANCE:  Well  appearing, well developed, well nourished, NAD HEENT:  Atraumatic, normocephalic, oropharynx clear NECK:  Supple without lymphadenopathy or thyromegaly ABDOMEN:  Soft, non-tender, no masses EXTREMITIES:  Moves all extremities well, without clubbing, cyanosis, or edema NEUROLOGIC:  Alert and oriented x 3, normal gait, CN II-XII grossly intact MENTAL STATUS:  appropriate BACK:  Non-tender to palpation, No CVAT SKIN:  Warm, dry, and intact  Results: U/A: 0-5 WBCs, 0-2 RBCs

## 2023-05-23 ENCOUNTER — Other Ambulatory Visit (HOSPITAL_BASED_OUTPATIENT_CLINIC_OR_DEPARTMENT_OTHER): Payer: Self-pay

## 2023-07-11 ENCOUNTER — Encounter: Payer: Self-pay | Admitting: Family Medicine

## 2023-07-11 ENCOUNTER — Ambulatory Visit: Payer: Self-pay | Admitting: Family Medicine

## 2023-07-11 ENCOUNTER — Ambulatory Visit (INDEPENDENT_AMBULATORY_CARE_PROVIDER_SITE_OTHER): Payer: BC Managed Care – PPO | Admitting: Family Medicine

## 2023-07-11 VITALS — BP 130/84 | HR 74 | Temp 98.0°F | Resp 16 | Ht 62.0 in | Wt 150.0 lb

## 2023-07-11 DIAGNOSIS — E039 Hypothyroidism, unspecified: Secondary | ICD-10-CM | POA: Diagnosis not present

## 2023-07-11 DIAGNOSIS — Z0000 Encounter for general adult medical examination without abnormal findings: Secondary | ICD-10-CM | POA: Diagnosis not present

## 2023-07-11 LAB — LIPID PANEL
Cholesterol: 191 mg/dL (ref 0–200)
HDL: 53.9 mg/dL (ref >39.00)
LDL Cholesterol: 121 mg/dL — ABNORMAL HIGH (ref 0–99)
NonHDL: 136.76
Total CHOL/HDL Ratio: 4
Triglycerides: 80 mg/dL (ref 0.0–149.0)
VLDL: 16 mg/dL (ref 0.0–40.0)

## 2023-07-11 LAB — CBC
HCT: 46.7 % — ABNORMAL HIGH (ref 36.0–46.0)
Hemoglobin: 15.7 g/dL — ABNORMAL HIGH (ref 12.0–15.0)
MCHC: 33.7 g/dL (ref 30.0–36.0)
MCV: 83.2 fl (ref 78.0–100.0)
Platelets: 263 10*3/uL (ref 150.0–400.0)
RBC: 5.62 Mil/uL — ABNORMAL HIGH (ref 3.87–5.11)
RDW: 13.8 % (ref 11.5–15.5)
WBC: 5.3 10*3/uL (ref 4.0–10.5)

## 2023-07-11 LAB — COMPREHENSIVE METABOLIC PANEL WITH GFR
ALT: 11 U/L (ref 0–35)
AST: 14 U/L (ref 0–37)
Albumin: 4.8 g/dL (ref 3.5–5.2)
Alkaline Phosphatase: 54 U/L (ref 39–117)
BUN: 12 mg/dL (ref 6–23)
CO2: 28 meq/L (ref 19–32)
Calcium: 9.7 mg/dL (ref 8.4–10.5)
Chloride: 103 meq/L (ref 96–112)
Creatinine, Ser: 1.07 mg/dL (ref 0.40–1.20)
GFR: 61.28 mL/min (ref >60.00)
Glucose, Bld: 81 mg/dL (ref 70–99)
Potassium: 4 meq/L (ref 3.5–5.1)
Sodium: 141 meq/L (ref 135–145)
Total Bilirubin: 0.6 mg/dL (ref 0.2–1.2)
Total Protein: 7.2 g/dL (ref 6.0–8.3)

## 2023-07-11 LAB — TSH: TSH: 3.55 u[IU]/mL (ref 0.35–5.50)

## 2023-07-11 NOTE — Progress Notes (Signed)
 Chief Complaint  Patient presents with   Annual Exam    CPE     Well Woman Kaitlyn Stone is here for a complete physical.   Her last physical was >1 year ago.  Current diet: in general, a "healthy" diet. Current exercise: walking, some strength training. Weight is intentionally decreasing and she denies fatigue out of ordinary. No LMP recorded. Patient is postmenopausal. Seatbelt? Yes Advanced directive? No  Health Maintenance Pap/HPV- Due Mammogram- Yes Tetanus- Yes Hep C screening- Yes HIV screening- Yes  Past Medical History:  Diagnosis Date   Hyperlipidemia    Hypothyroidism    Rheumatoid arthritis (HCC)      Past Surgical History:  Procedure Laterality Date   EXTRACORPOREAL SHOCK WAVE LITHOTRIPSY Left 03/27/2023   Procedure: EXTRACORPOREAL SHOCK WAVE LITHOTRIPSY (ESWL);  Surgeon: Scarlet Curly, MD;  Location: [facility];  Service: Urology;  Laterality: Left;   EYE SURGERY Bilateral    x2   HIP SURGERY Right    WRIST SURGERY Right     Medications  Current Outpatient Medications on File Prior to Visit  Medication Sig Dispense Refill   rosuvastatin  (CRESTOR ) 10 MG tablet Take 1 tablet (10 mg total) by mouth daily. 90 tablet 3   tamsulosin  (FLOMAX ) 0.4 MG CAPS capsule Take 1 capsule (0.4 mg total) by mouth daily. 14 capsule 1   tirzepatide  (ZEPBOUND ) 5 MG/0.5ML Pen Inject 5 mg into the skin once a week. 2 mL 5    Allergies Allergies  Allergen Reactions   Morphine Hives, Itching and Rash    "Face looked like a sunburn."    Review of Systems: Constitutional:  no unexpected weight changes Eye:  no recent significant change in vision Ear/Nose/Mouth/Throat:  Ears:  no recent change in hearing Nose/Mouth/Throat:  no complaints of nasal congestion, no sore throat Cardiovascular: no chest pain Respiratory:  no shortness of breath Gastrointestinal:  no abdominal pain, no change in bowel habits GU:  Female: negative for dysuria or pelvic  pain Musculoskeletal/Extremities:  no pain of the joints Integumentary (Skin/Breast):  no abnormal skin lesions reported Neurologic:  no headaches Endocrine:  denies fatigue Hematologic/Lymphatic:  No areas of easy bleeding  Exam BP 130/84 (BP Location: Left Arm, Patient Position: Sitting)   Pulse 74   Temp 98 F (36.7 C) (Oral)   Resp 16   Ht 5\' 2"  (1.575 m)   Wt 150 lb (68 kg)   SpO2 98%   BMI 27.44 kg/m  General:  well developed, well nourished, in no apparent distress Skin:  no significant moles, warts, or growths Head:  no masses, lesions, or tenderness Eyes:  pupils equal and round, sclera anicteric without injection Ears:  canals without lesions, TMs shiny without retraction, no obvious effusion, no erythema Nose:  nares patent, mucosa normal, and no drainage Throat/Pharynx:  lips and gingiva without lesion; tongue and uvula midline; non-inflamed pharynx; no exudates or postnasal drainage Neck: neck supple without adenopathy, thyromegaly, or masses Lungs:  clear to auscultation, breath sounds equal bilaterally, no respiratory distress Cardio:  regular rate and rhythm, no LE edema Abdomen:  abdomen soft, nontender; bowel sounds normal; no masses or organomegaly Genital: Defer to GYN Musculoskeletal:  symmetrical muscle groups noted without atrophy or deformity Extremities:  no clubbing, cyanosis, or edema, no deformities, no skin discoloration Neuro:  gait normal; deep tendon reflexes normal and symmetric Psych: well oriented with normal range of affect and appropriate judgment/insight  Assessment and Plan  Well adult exam - Plan: CBC, Comprehensive metabolic  panel with GFR, Lipid panel  Hypothyroidism, unspecified type - Plan: TSH   Well 48 y.o. female. Counseled on diet and exercise. Advanced directive form provided today.  CCS and cerv cancer rec'd.  Other orders as above. Follow up in 6 mo. The patient voiced understanding and agreement to the  plan.  Shellie Dials Tolley, DO 07/11/23 9:04 AM

## 2023-07-11 NOTE — Patient Instructions (Addendum)
 Give us  2-3 business days to get the results of your labs back.   Keep the diet clean and stay active.  Please get me a copy of your advanced directive form at your convenience.   Please schedule your colon cancer screening.  Please schedule your pelvic exam.   Let us  know if you need anything.

## 2023-07-12 ENCOUNTER — Other Ambulatory Visit: Payer: Self-pay

## 2023-07-12 DIAGNOSIS — E7800 Pure hypercholesterolemia, unspecified: Secondary | ICD-10-CM

## 2023-07-20 ENCOUNTER — Ambulatory Visit: Admitting: Family Medicine

## 2023-07-20 ENCOUNTER — Other Ambulatory Visit

## 2023-07-20 ENCOUNTER — Telehealth: Payer: Self-pay

## 2023-07-20 DIAGNOSIS — N201 Calculus of ureter: Secondary | ICD-10-CM

## 2023-07-20 LAB — MICROSCOPIC EXAMINATION

## 2023-07-20 LAB — URINALYSIS, ROUTINE W REFLEX MICROSCOPIC
Bilirubin, UA: NEGATIVE
Glucose, UA: NEGATIVE
Ketones, UA: NEGATIVE
Leukocytes,UA: NEGATIVE
Nitrite, UA: NEGATIVE
Protein,UA: NEGATIVE
Specific Gravity, UA: 1.01 (ref 1.005–1.030)
Urobilinogen, Ur: 0.2 mg/dL (ref 0.2–1.0)
pH, UA: 6 (ref 5.0–7.5)

## 2023-07-20 NOTE — Telephone Encounter (Signed)
 Spoke with pt in reference to u/a results. Pt stated that she has been having this pain since Sunday. Pt states its left sided pain, below her stomach, almost in her ovary area. She states that she is constantly having the feeling of needing to urinate but isnt always able to. Pt questioned another stone. Please advise.

## 2023-07-20 NOTE — Telephone Encounter (Signed)
-----   Message from Oda Bence sent at 07/20/2023  1:04 PM EDT ----- Please notify Kaitlyn Stone that her U/A does not show evidence of a UTI.  No microscopic blood seen either.  If her symptoms continue, please let us  know.

## 2023-07-20 NOTE — Telephone Encounter (Signed)
 Spoke with pt in reference urinary s/s and possible stone. Pt voiced understanding.

## 2023-07-21 ENCOUNTER — Other Ambulatory Visit (HOSPITAL_BASED_OUTPATIENT_CLINIC_OR_DEPARTMENT_OTHER): Payer: Self-pay

## 2023-07-24 ENCOUNTER — Encounter: Payer: Self-pay | Admitting: Urology

## 2023-07-24 ENCOUNTER — Other Ambulatory Visit: Payer: Self-pay | Admitting: Urology

## 2023-07-24 DIAGNOSIS — N201 Calculus of ureter: Secondary | ICD-10-CM

## 2023-07-25 NOTE — Addendum Note (Signed)
 Addended by: Westley Hammers on: 07/25/2023 10:03 AM   Modules accepted: Orders

## 2023-07-26 ENCOUNTER — Ambulatory Visit
Admission: RE | Admit: 2023-07-26 | Discharge: 2023-07-26 | Disposition: A | Discharge status: Home / Self Care | Source: Ambulatory Visit | Attending: Urology | Admitting: Urology

## 2023-07-26 DIAGNOSIS — N201 Calculus of ureter: Secondary | ICD-10-CM

## 2023-08-02 ENCOUNTER — Encounter: Payer: Self-pay | Admitting: Urology

## 2023-08-04 ENCOUNTER — Other Ambulatory Visit (HOSPITAL_BASED_OUTPATIENT_CLINIC_OR_DEPARTMENT_OTHER): Payer: Self-pay

## 2023-08-04 ENCOUNTER — Encounter: Payer: Self-pay | Admitting: Family Medicine

## 2023-08-07 ENCOUNTER — Other Ambulatory Visit: Payer: Self-pay | Admitting: Urology

## 2023-08-07 ENCOUNTER — Ambulatory Visit: Payer: Self-pay | Admitting: Urology

## 2023-08-07 DIAGNOSIS — N201 Calculus of ureter: Secondary | ICD-10-CM

## 2023-08-08 ENCOUNTER — Other Ambulatory Visit (HOSPITAL_BASED_OUTPATIENT_CLINIC_OR_DEPARTMENT_OTHER): Payer: Self-pay

## 2023-08-08 ENCOUNTER — Encounter: Payer: Self-pay | Admitting: Urology

## 2023-08-08 ENCOUNTER — Ambulatory Visit (HOSPITAL_BASED_OUTPATIENT_CLINIC_OR_DEPARTMENT_OTHER)
Admission: RE | Admit: 2023-08-08 | Discharge: 2023-08-08 | Disposition: A | Discharge status: Home / Self Care | Source: Ambulatory Visit | Attending: Urology | Admitting: Urology

## 2023-08-08 ENCOUNTER — Ambulatory Visit: Admitting: Urology

## 2023-08-08 VITALS — BP 139/85 | HR 90 | Ht 62.0 in | Wt 147.0 lb

## 2023-08-08 DIAGNOSIS — N201 Calculus of ureter: Secondary | ICD-10-CM

## 2023-08-08 LAB — URINALYSIS, ROUTINE W REFLEX MICROSCOPIC
Bilirubin, UA: NEGATIVE
Glucose, UA: NEGATIVE
Ketones, UA: NEGATIVE
Leukocytes,UA: NEGATIVE
Nitrite, UA: NEGATIVE
Protein,UA: NEGATIVE
RBC, UA: NEGATIVE
Specific Gravity, UA: <1.005 — ABNORMAL LOW (ref 1.005–1.030)
Urobilinogen, Ur: 0.2 mg/dL (ref 0.2–1.0)
pH, UA: 6.5 (ref 5.0–7.5)

## 2023-08-08 MED ORDER — HYDROCODONE-ACETAMINOPHEN 5-325 MG PO TABS
1.0000 | ORAL_TABLET | Freq: Four times a day (QID) | ORAL | 0 refills | Status: DC | PRN
  Filled 2023-08-08: qty 15, 4d supply, fill #0

## 2023-08-08 NOTE — Progress Notes (Signed)
 Assessment: 1. Ureteral calculus     Plan: I personally reviewed the CT study from 08/03/2023 with results as noted below. I personally reviewed the KUB from today with results as noted below. I discussed these results with the patient today.  Her symptoms have improved, her urinalysis is normal, and the KUB shows an apparent calcification in the midline consistent with spontaneous passage of the stone into the bladder. Continue to strain urine. She has prescriptions for tamsulosin  and pain medicine as needed. She will contact the office next week if she has recurrence of her symptoms or if she passes the stone.  Chief Complaint:  Chief Complaint  Patient presents with   Nephrolithiasis    History of Present Illness:  Kaitlyn Stone is a 48 y.o. female who is seen for further evaluation of left ureteral calculus. She was seen in the emergency room on 02/24/2023 with left sided abdominal pain.  No flank pain.  She did have some gross hematuria.  No fevers or chills.   CT imaging demonstrated a 7 x 4 mm stone in the mid left ureter with mild left hydronephrosis and perinephric stranding. She has a prior history of nephrolithiasis in 2011.  She underwent ureteroscopic stone manipulation at that time. At her visit on 03/10/23, she continued to have some mild discomfort now in the left lower quadrant.  She was on Flomax  and was straining her urine.  She was not aware of passing a stone.  She was taking ibuprofen  for her pain.  No fevers chills, nausea, or vomiting.  No dysuria or gross hematuria. KUB from 03/10/2023 showed a 4 x 5 mm calcification in the left pelvis consistent with the left ureteral calculus. Treatment options were again discussed.  She elected to continue to attempt spontaneous passage. KUB from 03/23/2023 showed persistence of a 4 x 6 mm calcification in the left pelvis consistent with a left ureteral calculus. She underwent left ESL on 03/27/2023.  At her visit on 05-05-23,  she had passed some small stone fragments and gravel.  No left-sided flank pain or abdominal pain.  No fevers or chills.  No dysuria or gross hematuria.  She had not been taking tamsulosin . KUB from 05/05/2023 showed a persistent 3 x 4 mm calcification in the left pelvis consistent with a distal ureteral fragment following lithotripsy.  KUB from 05/15/23 did not show any obvious calcifications along the expected course of the left ureter or either renal shadow.  At her visit in April 2025, she reported an episode of left-sided flank pain the week prior to her visit.  She was not aware of passing a stone but did not strain her urine on a regular basis.  She was not having any symptoms at the time of her visit.  No dysuria or gross hematuria. She noted onset of lower urinary tract symptoms including frequency and urgency. Urinalysis from 07/20/2023 showed 0-5 WBCs, 0-2 RBCs, and few bacteria. Due to persistent symptoms she was evaluated with CT imaging on 07/26/2023.  This showed a 7 x 4 mm calculus at the left UVJ with fullness of the left collecting system.  She presents today for evaluation.  She reports some left-sided flank pain several days ago.  She has not had any pain within the past 24 hours.  Her lower urinary tract symptoms have also improved as well.  She is not having any frequency or dysuria today.  No nausea, vomiting, fever, or chills.  She continues on tamsulosin .  She has  been straining her urine and has not passed a stone.  Portions of the above documentation were copied from a prior visit for review purposes only.   Past Medical History:  Past Medical History:  Diagnosis Date   Hyperlipidemia    Hypothyroidism    Rheumatoid arthritis (HCC)     Past Surgical History:  Past Surgical History:  Procedure Laterality Date   EXTRACORPOREAL SHOCK WAVE LITHOTRIPSY Left 03/27/2023   Procedure: EXTRACORPOREAL SHOCK WAVE LITHOTRIPSY (ESWL);  Surgeon: Shona Layman BROCKS, MD;  Location: [facility];   Service: Urology;  Laterality: Left;   EYE SURGERY Bilateral    x2   HIP SURGERY Right    WRIST SURGERY Right     Allergies:  Allergies  Allergen Reactions   Morphine Hives, Itching and Rash    Face looked like a sunburn.    Family History:  Family History  Problem Relation Age of Onset   Heart disease Mother    Cancer Mother    Heart disease Father    Diabetes Brother    Healthy Daughter    Polycystic ovary syndrome Daughter    Healthy Daughter    Healthy Daughter     Social History:  Social History   Tobacco Use   Smoking status: Never   Smokeless tobacco: Never  Vaping Use   Vaping status: Never Used  Substance Use Topics   Alcohol use: Never   Drug use: Never    ROS: Constitutional:  Negative for fever, chills, weight loss CV: Negative for chest pain, previous MI, hypertension Respiratory:  Negative for shortness of breath, wheezing, sleep apnea, frequent cough GI:  Negative for nausea, vomiting, bloody stool, GERD   Physical exam: BP 139/85   Pulse 90   Ht 5' 2 (1.575 m)   Wt 147 lb (66.7 kg)   BMI 26.89 kg/m  GENERAL APPEARANCE:  Well appearing, well developed, well nourished, NAD HEENT:  Atraumatic, normocephalic, oropharynx clear NECK:  Supple without lymphadenopathy or thyromegaly ABDOMEN:  Soft, non-tender, no masses EXTREMITIES:  Moves all extremities well, without clubbing, cyanosis, or edema NEUROLOGIC:  Alert and oriented x 3, normal gait, CN II-XII grossly intact MENTAL STATUS:  appropriate BACK:  Non-tender to palpation, No CVAT SKIN:  Warm, dry, and intact  Results: U/A: Negative  KUB today: Nonspecific bowel gas pattern; calcifications in bilateral pelvis consistent with phleboliths; I do not see an obvious calcification in the area of the left distal ureter similar in size to that seen on the CT study; there is a 3 x 6 mm faint calcification seen in the midline which may be consistent with passage of the stone into the  bladder.

## 2023-08-09 ENCOUNTER — Telehealth: Admitting: Physician Assistant

## 2023-08-09 DIAGNOSIS — J029 Acute pharyngitis, unspecified: Secondary | ICD-10-CM | POA: Diagnosis not present

## 2023-08-09 NOTE — Progress Notes (Signed)

## 2023-08-09 NOTE — Progress Notes (Signed)
 I have spent 5 minutes in review of e-visit questionnaire, review and updating patient chart, medical decision making and response to patient.   Laure Kidney, PA-C

## 2023-08-10 ENCOUNTER — Ambulatory Visit (HOSPITAL_BASED_OUTPATIENT_CLINIC_OR_DEPARTMENT_OTHER)
Admission: RE | Admit: 2023-08-10 | Discharge: 2023-08-10 | Disposition: A | Discharge status: Home / Self Care | Source: Ambulatory Visit | Attending: Family Medicine | Admitting: Family Medicine

## 2023-08-10 ENCOUNTER — Encounter (HOSPITAL_BASED_OUTPATIENT_CLINIC_OR_DEPARTMENT_OTHER): Payer: Self-pay

## 2023-08-10 ENCOUNTER — Other Ambulatory Visit (HOSPITAL_BASED_OUTPATIENT_CLINIC_OR_DEPARTMENT_OTHER): Payer: Self-pay

## 2023-08-10 VITALS — BP 112/81 | HR 99 | Temp 98.5°F | Resp 20

## 2023-08-10 DIAGNOSIS — R051 Acute cough: Secondary | ICD-10-CM

## 2023-08-10 MED ORDER — AZITHROMYCIN 250 MG PO TABS
ORAL_TABLET | ORAL | 0 refills | Status: AC
  Filled 2023-08-10 (×2): qty 6, 5d supply, fill #0

## 2023-08-10 NOTE — ED Provider Notes (Signed)
 [facility]    CSN: [id number] Arrival date & time: 08/10/23  1026      History   Chief Complaint Chief Complaint  Patient presents with   Sore Throat    I started with a sore throat on Monday now I have a headache and a productive cough with yellow green phlegm - Entered by patient    HPI Kaitlyn Stone is a 48 y.o. female.   Patient is a 48 year old female who presents today with pt c/o sore throat, nasal congestion, HA, and cough (productive- yellow/green).  This started on Monday.  Symptoms have worsened.  Pt denies fever, chills, and body aches. She has taken OTC cold and flu with no relief.  She is concerned about potential pneumonia based on history.  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   Pain in right elbow 07/23/2020   Mixed hyperlipidemia 03/14/2018   Class 1 obesity due to excess calories without serious comorbidity with body mass index (BMI) of 31.0 to 31.9 in adult 10/18/2017   History of irregular heartbeat 10/18/2017   Hypothyroidism 10/18/2017   Palpitations 09/08/2014    Past Surgical History:  Procedure Laterality Date   EXTRACORPOREAL SHOCK WAVE LITHOTRIPSY Left 03/27/2023   Procedure: EXTRACORPOREAL SHOCK WAVE LITHOTRIPSY (ESWL);  Surgeon: Shona Layman BROCKS, MD;  Location: [facility];  Service: Urology;  Laterality: Left;   EYE SURGERY Bilateral    x2   HIP SURGERY Right    WRIST SURGERY Right     OB History     Gravida  5   Para  5   Term      Preterm      AB      Living         SAB      IAB      Ectopic      Multiple      Live Births               Home Medications     Prior to Admission medications   Medication Sig Start Date End Date Taking? Authorizing Provider  azithromycin (ZITHROMAX) 250 MG tablet Take 2 tablets (500 mg total) by mouth daily for 1 day, THEN 1 tablet (250 mg total) daily for 4 days. 08/10/23 08/15/23 Yes Jaegar Croft A, FNP  HYDROcodone-acetaminophen  (NORCO/VICODIN) 5-325 MG tablet Take 1 tablet by mouth every 6 (six) hours as needed for severe pain (pain score 7-10). 08/08/23   Stoneking, Adine PARAS., MD  rosuvastatin  (CRESTOR ) 10 MG tablet Take 1 tablet (10 mg total) by mouth daily. 05/08/23   Frann Mabel Mt, DO  tamsulosin  (FLOMAX ) 0.4 MG CAPS capsule Take 1 capsule (0.4 mg total) by mouth daily. 04/27/23   Stoneking, Adine PARAS., MD  tirzepatide  (ZEPBOUND ) 5 MG/0.5ML Pen Inject 5 mg into the skin once a week. 04/27/23   Frann Mabel Mt, DO    Family History Family History  Problem Relation Age of Onset   Heart disease Mother    Cancer Mother    Heart disease Father    Diabetes Brother    Healthy Daughter  Polycystic ovary syndrome Daughter    Healthy Daughter    Healthy Daughter     Social History Social History   Tobacco Use   Smoking status: Never   Smokeless tobacco: Never  Vaping Use   Vaping status: Never Used  Substance Use Topics   Alcohol use: Never   Drug use: Never     Allergies   Morphine   Review of Systems Review of Systems  See HPI Physical Exam Triage Vital Signs ED Triage Vitals  Encounter Vitals Group     BP 08/10/23 1034 112/81     [name] Systolic BP Percentile --      [name] Diastolic BP Percentile --      Boys Systolic BP Percentile --      Boys Diastolic BP Percentile --      Pulse Rate 08/10/23 1034 99     Resp 08/10/23 1034 20     Temp 08/10/23 1034 98.5 F (36.9 C)     Temp Source 08/10/23 1034 Oral     SpO2 08/10/23 1034 96 %     Weight --      Height --      Head Circumference --      Peak Flow --      Pain Score 08/10/23 1032 4     Pain Loc --      Pain  Education --      Exclude from Growth Chart --    No data found.  Updated Vital Signs BP 112/81 (BP Location: Right Arm)   Pulse 99   Temp 98.5 F (36.9 C) (Oral)   Resp 20   SpO2 96%   Visual Acuity Right Eye Distance:   Left Eye Distance:   Bilateral Distance:    Right Eye Near:   Left Eye Near:    Bilateral Near:     Physical Exam Vitals and nursing note reviewed.  Constitutional:      General: She is not in acute distress.    Appearance: Normal appearance. She is not ill-appearing, toxic-appearing or diaphoretic.  HENT:     Head: Normocephalic and atraumatic.     Right Ear: Tympanic membrane and ear canal normal.     Left Ear: Tympanic membrane and ear canal normal.     Nose: Congestion present.     Mouth/Throat:     Pharynx: Oropharynx is clear. Posterior oropharyngeal erythema present.  Eyes:     Conjunctiva/sclera: Conjunctivae normal.  Cardiovascular:     Rate and Rhythm: Normal rate and regular rhythm.     Pulses: Normal pulses.     Heart sounds: Normal heart sounds.  Pulmonary:     Effort: Pulmonary effort is normal.     Breath sounds: Normal breath sounds.  Skin:    General: Skin is warm and dry.  Neurological:     Mental Status: She is alert.  Psychiatric:        Mood and Affect: Mood normal.      UC Treatments / Results  Labs (all labs ordered are listed, but only abnormal results are displayed) Labs Reviewed - No data to display  EKG   Radiology No results found.  Procedures Procedures (including critical care time)  Medications Ordered in UC Medications - No data to display  Initial Impression / Assessment and Plan / UC Course  I have reviewed the triage vital signs and the nursing notes.  Pertinent labs & imaging results that were available during my care of the patient  were reviewed by me and considered in my medical decision making (see chart for details).     Acute cough- no concerns on exam. Pt worried that symptoms are  worsening and she is concerned about getting PNA. She reports she will be out of town this weekend.  Will go ahead and prescribed Z pac. She can continue OTC meds as needed F/U as needed  Final Clinical Impressions(s) / UC Diagnoses   Final diagnoses:  Acute cough     Discharge Instructions      Take the antibiotics as prescribed.  Follow-up for any continued issues.  You can take over-the-counter medications as needed recommend Mucinex.    ED Prescriptions     Medication Sig Dispense Auth. Provider   azithromycin (ZITHROMAX) 250 MG tablet Take 2 tablets (500 mg total) by mouth daily for 1 day, THEN 1 tablet (250 mg total) daily for 4 days. 6 tablet Adah Wilbert LABOR, FNP      PDMP not reviewed this encounter.   Adah Wilbert LABOR, FNP 08/10/23 1112

## 2023-08-10 NOTE — Discharge Instructions (Signed)
 Take the antibiotics as prescribed.  Follow-up for any continued issues.  You can take over-the-counter medications as needed recommend Mucinex.

## 2023-08-10 NOTE — ED Triage Notes (Signed)
 Pt c/o sore throat, nasal congestion, HA, and cough (productive- yellow/green). Pt denies fever, chills, and body aches. She has taken OTC cold and flu with no relief. Sore throat was worst this morning.

## 2023-08-16 ENCOUNTER — Encounter: Payer: Self-pay | Admitting: Family Medicine

## 2023-08-16 DIAGNOSIS — Z6826 Body mass index (BMI) 26.0-26.9, adult: Secondary | ICD-10-CM | POA: Diagnosis not present

## 2023-08-16 DIAGNOSIS — J069 Acute upper respiratory infection, unspecified: Secondary | ICD-10-CM | POA: Diagnosis not present

## 2023-08-16 NOTE — Telephone Encounter (Signed)
 Called pt was advised to see someone there and Pt stated understand, she  would go Urgent Care.

## 2023-08-29 ENCOUNTER — Other Ambulatory Visit (HOSPITAL_BASED_OUTPATIENT_CLINIC_OR_DEPARTMENT_OTHER): Payer: Self-pay

## 2023-09-08 ENCOUNTER — Other Ambulatory Visit (HOSPITAL_BASED_OUTPATIENT_CLINIC_OR_DEPARTMENT_OTHER): Payer: Self-pay

## 2023-10-23 ENCOUNTER — Other Ambulatory Visit (HOSPITAL_BASED_OUTPATIENT_CLINIC_OR_DEPARTMENT_OTHER): Payer: Self-pay

## 2023-10-25 ENCOUNTER — Other Ambulatory Visit (HOSPITAL_BASED_OUTPATIENT_CLINIC_OR_DEPARTMENT_OTHER): Payer: Self-pay

## 2023-10-26 ENCOUNTER — Other Ambulatory Visit (HOSPITAL_BASED_OUTPATIENT_CLINIC_OR_DEPARTMENT_OTHER): Payer: Self-pay

## 2023-10-27 ENCOUNTER — Other Ambulatory Visit (HOSPITAL_BASED_OUTPATIENT_CLINIC_OR_DEPARTMENT_OTHER): Payer: Self-pay

## 2023-10-30 ENCOUNTER — Other Ambulatory Visit (HOSPITAL_BASED_OUTPATIENT_CLINIC_OR_DEPARTMENT_OTHER): Payer: Self-pay

## 2023-10-31 ENCOUNTER — Other Ambulatory Visit (HOSPITAL_BASED_OUTPATIENT_CLINIC_OR_DEPARTMENT_OTHER): Payer: Self-pay

## 2023-11-02 ENCOUNTER — Telehealth: Payer: Self-pay

## 2023-11-02 ENCOUNTER — Encounter: Payer: Self-pay | Admitting: Family Medicine

## 2023-11-02 ENCOUNTER — Other Ambulatory Visit (HOSPITAL_COMMUNITY): Payer: Self-pay

## 2023-11-02 ENCOUNTER — Other Ambulatory Visit (HOSPITAL_BASED_OUTPATIENT_CLINIC_OR_DEPARTMENT_OTHER): Payer: Self-pay

## 2023-11-02 NOTE — Telephone Encounter (Signed)
 Pharmacy Patient Advocate Encounter   Received notification from Patient Advice Request messages that prior authorization for Zepbound  5mg /0.41ml is required/requested.   Insurance verification completed.   The patient is insured through [facility] .   Per test claim: PA required; PA submitted to above mentioned insurance via Latent Key/confirmation #/EOC [id number] Status is pending

## 2023-11-03 ENCOUNTER — Other Ambulatory Visit (HOSPITAL_COMMUNITY): Payer: Self-pay

## 2023-11-03 ENCOUNTER — Other Ambulatory Visit (HOSPITAL_BASED_OUTPATIENT_CLINIC_OR_DEPARTMENT_OTHER): Payer: Self-pay

## 2023-11-03 NOTE — Telephone Encounter (Signed)
 Pharmacy Patient Advocate Encounter  Received notification from [facility] that Prior Authorization for  Zepbound  5mg /0.57ml  has been APPROVED from 11/02/23 to 11/01/24. Ran test claim, Copay is $25. This test claim was processed through [facility]- copay amounts may vary at other pharmacies due to pharmacy/plan contracts, or as the patient moves through the different stages of their insurance plan.   PA #/Case ID/Reference #: [id number]

## 2023-11-15 ENCOUNTER — Other Ambulatory Visit (HOSPITAL_BASED_OUTPATIENT_CLINIC_OR_DEPARTMENT_OTHER): Payer: Self-pay

## 2023-11-16 ENCOUNTER — Other Ambulatory Visit (HOSPITAL_BASED_OUTPATIENT_CLINIC_OR_DEPARTMENT_OTHER): Payer: Self-pay

## 2024-01-12 ENCOUNTER — Ambulatory Visit: Admitting: Family Medicine

## 2024-01-23 ENCOUNTER — Ambulatory Visit: Admitting: Family Medicine

## 2024-02-12 ENCOUNTER — Ambulatory Visit: Payer: Self-pay | Admitting: Family Medicine

## 2024-02-12 ENCOUNTER — Other Ambulatory Visit (HOSPITAL_BASED_OUTPATIENT_CLINIC_OR_DEPARTMENT_OTHER): Payer: Self-pay

## 2024-02-12 ENCOUNTER — Ambulatory Visit: Admitting: Family Medicine

## 2024-02-12 VITALS — BP 128/82 | HR 100 | Temp 98.0°F | Resp 16 | Ht 62.0 in | Wt 151.4 lb

## 2024-02-12 DIAGNOSIS — E669 Obesity, unspecified: Secondary | ICD-10-CM | POA: Diagnosis not present

## 2024-02-12 DIAGNOSIS — E782 Mixed hyperlipidemia: Secondary | ICD-10-CM

## 2024-02-12 DIAGNOSIS — Z1211 Encounter for screening for malignant neoplasm of colon: Secondary | ICD-10-CM

## 2024-02-12 LAB — COMPREHENSIVE METABOLIC PANEL WITH GFR
ALT: 11 U/L (ref 3–35)
AST: 13 U/L (ref 5–37)
Albumin: 4.4 g/dL (ref 3.5–5.2)
Alkaline Phosphatase: 56 U/L (ref 39–117)
BUN: 17 mg/dL (ref 6–23)
CO2: 29 meq/L (ref 19–32)
Calcium: 9.1 mg/dL (ref 8.4–10.5)
Chloride: 104 meq/L (ref 96–112)
Creatinine, Ser: 1.02 mg/dL (ref 0.40–1.20)
GFR: 64.63 mL/min
Glucose, Bld: 82 mg/dL (ref 70–99)
Potassium: 4.1 meq/L (ref 3.5–5.1)
Sodium: 139 meq/L (ref 135–145)
Total Bilirubin: 0.5 mg/dL (ref 0.2–1.2)
Total Protein: 7 g/dL (ref 6.0–8.3)

## 2024-02-12 LAB — LIPID PANEL
Cholesterol: 252 mg/dL — ABNORMAL HIGH (ref 28–200)
HDL: 59.4 mg/dL
LDL Cholesterol: 176 mg/dL — ABNORMAL HIGH (ref 10–99)
NonHDL: 192.23
Total CHOL/HDL Ratio: 4
Triglycerides: 80 mg/dL (ref 10.0–149.0)
VLDL: 16 mg/dL (ref 0.0–40.0)

## 2024-02-12 MED ORDER — ZEPBOUND 5 MG/0.5ML ~~LOC~~ SOAJ
5.0000 mg | SUBCUTANEOUS | 5 refills | Status: AC
  Filled 2024-02-12: qty 2, 28d supply, fill #0

## 2024-02-12 NOTE — Patient Instructions (Signed)
 Keep the diet clean and stay active.  Please consider adding some weight resistance exercise to your routine. Consider yoga as well.   Give us  2-3 business days to get the results of your labs back.   Let us  know if you need anything.

## 2024-02-12 NOTE — Progress Notes (Signed)
 Chief Complaint  Patient presents with   Medication Refill    Medication Check    Subjective: Hyperlipidemia Patient presents for Hyperlipidemia follow up. Currently taking Crestor  10 mg daily and compliance with treatment thus far has been OK. She denies myalgias. She is adhering to a healthy diet. Exercise: walking No chest pain or shortness of breath The patient is not known to have coexisting coronary artery disease.  Patient has a history of obesity.  She is currently taking Zepbound  5 mg weekly.  Compliant, no adverse effects.  It has helped her lose a significant amount of weight.  She is currently content with her specific dosage.  Past Medical History:  Diagnosis Date   Hyperlipidemia    Hypothyroidism    Rheumatoid arthritis (HCC)     Objective: BP 128/82 (BP Location: Left Arm, Patient Position: Sitting)   Pulse 100   Temp 98 F (36.7 C) (Oral)   Resp 16   Ht 5' 2 (1.575 m)   Wt 151 lb 6.4 oz (68.7 kg)   SpO2 97%   BMI 27.69 kg/m  General: Awake, appears stated age Heart: RRR, no LE edema, no bruits Lungs: CTAB, no rales, wheezes or rhonchi. No accessory muscle use Psych: Age appropriate judgment and insight, normal affect and mood  Assessment and Plan: Mixed hyperlipidemia  Obesity with serious comorbidity, unspecified class, unspecified obesity type  Chronic, stable. Cont Crestor  10 mg/d. Ck labs. Chronic, stable. Cont Zepbound  5 mg/week.  F/u in 6 mo. The patient voiced understanding and agreement to the plan.  Mabel Mt [place], DO 02/12/2024  9:53 AM

## 2024-02-26 ENCOUNTER — Other Ambulatory Visit (HOSPITAL_BASED_OUTPATIENT_CLINIC_OR_DEPARTMENT_OTHER): Payer: Self-pay

## 2024-08-23 ENCOUNTER — Encounter: Admitting: Family Medicine
